# Patient Record
Sex: Male | Born: 1970 | Race: White | Hispanic: No | Marital: Single | Smoking: Never smoker
Health system: Southern US, Community
[De-identification: ages and names within clinical notes are randomized; demographics above are authoritative.]

## PROBLEM LIST (undated history)

## (undated) DIAGNOSIS — R51 Headache: Secondary | ICD-10-CM

## (undated) DIAGNOSIS — C801 Malignant (primary) neoplasm, unspecified: Secondary | ICD-10-CM

## (undated) DIAGNOSIS — R519 Headache, unspecified: Secondary | ICD-10-CM

## (undated) HISTORY — PX: SOFT TISSUE MASS EXCISION: SHX2419

---

## 2000-11-14 ENCOUNTER — Emergency Department (HOSPITAL_COMMUNITY): Admission: EM | Admit: 2000-11-14 | Discharge: 2000-11-14 | Payer: Self-pay | Admitting: Emergency Medicine

## 2000-11-14 ENCOUNTER — Encounter: Payer: Self-pay | Admitting: Internal Medicine

## 2000-11-17 ENCOUNTER — Ambulatory Visit (HOSPITAL_COMMUNITY): Admission: RE | Admit: 2000-11-17 | Discharge: 2000-11-17 | Payer: Self-pay | Admitting: Internal Medicine

## 2000-11-17 ENCOUNTER — Encounter: Payer: Self-pay | Admitting: Internal Medicine

## 2005-04-04 ENCOUNTER — Other Ambulatory Visit: Admission: RE | Admit: 2005-04-04 | Discharge: 2005-04-04 | Payer: Self-pay | Admitting: Urology

## 2007-11-30 ENCOUNTER — Emergency Department (HOSPITAL_COMMUNITY): Admission: EM | Admit: 2007-11-30 | Discharge: 2007-11-30 | Payer: Self-pay | Admitting: Emergency Medicine

## 2010-05-08 ENCOUNTER — Emergency Department (HOSPITAL_COMMUNITY): Payer: BC Managed Care – PPO

## 2010-05-08 ENCOUNTER — Emergency Department (HOSPITAL_COMMUNITY)
Admission: EM | Admit: 2010-05-08 | Discharge: 2010-05-08 | Disposition: A | Discharge status: Home / Self Care | Payer: BC Managed Care – PPO | Attending: Emergency Medicine | Admitting: Emergency Medicine

## 2010-05-08 DIAGNOSIS — S025XXA Fracture of tooth (traumatic), initial encounter for closed fracture: Secondary | ICD-10-CM | POA: Insufficient documentation

## 2010-05-08 DIAGNOSIS — R296 Repeated falls: Secondary | ICD-10-CM | POA: Insufficient documentation

## 2010-05-08 DIAGNOSIS — S060X9A Concussion with loss of consciousness of unspecified duration, initial encounter: Secondary | ICD-10-CM | POA: Insufficient documentation

## 2010-05-08 DIAGNOSIS — R55 Syncope and collapse: Secondary | ICD-10-CM | POA: Insufficient documentation

## 2010-05-08 DIAGNOSIS — IMO0002 Reserved for concepts with insufficient information to code with codable children: Secondary | ICD-10-CM | POA: Insufficient documentation

## 2010-05-08 DIAGNOSIS — R4182 Altered mental status, unspecified: Secondary | ICD-10-CM | POA: Insufficient documentation

## 2010-05-08 DIAGNOSIS — S0990XA Unspecified injury of head, initial encounter: Secondary | ICD-10-CM | POA: Insufficient documentation

## 2010-05-08 DIAGNOSIS — Y998 Other external cause status: Secondary | ICD-10-CM | POA: Insufficient documentation

## 2010-05-08 LAB — COMPREHENSIVE METABOLIC PANEL
ALT: 22 U/L (ref 0–53)
AST: 21 U/L (ref 0–37)
Albumin: 4.2 g/dL (ref 3.5–5.2)
Alkaline Phosphatase: 40 U/L (ref 39–117)
CO2: 26 mEq/L (ref 19–32)
Chloride: 104 mEq/L (ref 96–112)
GFR calc Af Amer: >60 mL/min (ref >60)
GFR calc non Af Amer: >60 mL/min (ref >60)
Potassium: 3.7 mEq/L (ref 3.5–5.1)
Sodium: 137 mEq/L (ref 135–145)
Total Bilirubin: 0.9 mg/dL (ref 0.3–1.2)

## 2010-05-08 LAB — POCT CARDIAC MARKERS
Myoglobin, poc: 115 ng/mL (ref 12–200)
Myoglobin, poc: 151 ng/mL (ref 12–200)
Troponin i, poc: <0.05 ng/mL (ref 0.00–0.09)

## 2010-05-08 LAB — URINALYSIS, ROUTINE W REFLEX MICROSCOPIC
Glucose, UA: NEGATIVE mg/dL
Hgb urine dipstick: NEGATIVE
Protein, ur: NEGATIVE mg/dL
Specific Gravity, Urine: 1.025 (ref 1.005–1.030)
pH: 5.5 (ref 5.0–8.0)

## 2010-05-08 LAB — CBC
Hemoglobin: 16.7 g/dL (ref 13.0–17.0)
MCH: 30.1 pg (ref 26.0–34.0)
Platelets: 187 10*3/uL (ref 150–400)
RBC: 5.55 MIL/uL (ref 4.22–5.81)
WBC: 7.3 10*3/uL (ref 4.0–10.5)

## 2010-05-08 LAB — DIFFERENTIAL
Basophils Relative: 0 % (ref 0–1)
Eosinophils Absolute: 0.2 10*3/uL (ref 0.0–0.7)
Lymphs Abs: 2.4 10*3/uL (ref 0.7–4.0)
Monocytes Relative: 8 % (ref 3–12)
Neutro Abs: 4.1 10*3/uL (ref 1.7–7.7)
Neutrophils Relative %: 56 % (ref 43–77)

## 2010-10-29 LAB — CBC
HCT: 48
Hemoglobin: 16
MCHC: 33.4
Platelets: 218
RDW: 12.2

## 2010-10-29 LAB — DIFFERENTIAL
Basophils Absolute: 0
Basophils Relative: 0
Eosinophils Relative: 1
Monocytes Absolute: 0.6
Neutro Abs: 5.6

## 2010-10-29 LAB — BASIC METABOLIC PANEL
BUN: 14
CO2: 27
Calcium: 9.6
Glucose, Bld: 96
Sodium: 140

## 2013-11-07 ENCOUNTER — Observation Stay (HOSPITAL_COMMUNITY)
Admission: EM | Admit: 2013-11-07 | Discharge: 2013-11-08 | Disposition: A | Discharge status: Home / Self Care | Payer: 59 | Attending: Internal Medicine | Admitting: Internal Medicine

## 2013-11-07 ENCOUNTER — Encounter (HOSPITAL_COMMUNITY): Payer: Self-pay | Admitting: Emergency Medicine

## 2013-11-07 ENCOUNTER — Observation Stay (HOSPITAL_COMMUNITY)
Admission: EM | Admit: 2013-11-07 | Discharge: 2013-11-07 | Disposition: A | Discharge status: Home / Self Care | Payer: 59 | Source: Home / Self Care | Attending: Internal Medicine | Admitting: Internal Medicine

## 2013-11-07 ENCOUNTER — Emergency Department (HOSPITAL_COMMUNITY): Payer: 59

## 2013-11-07 DIAGNOSIS — R569 Unspecified convulsions: Secondary | ICD-10-CM | POA: Diagnosis not present

## 2013-11-07 DIAGNOSIS — G8929 Other chronic pain: Secondary | ICD-10-CM | POA: Insufficient documentation

## 2013-11-07 DIAGNOSIS — R079 Chest pain, unspecified: Secondary | ICD-10-CM

## 2013-11-07 DIAGNOSIS — R51 Headache: Secondary | ICD-10-CM

## 2013-11-07 DIAGNOSIS — N179 Acute kidney failure, unspecified: Secondary | ICD-10-CM

## 2013-11-07 DIAGNOSIS — R519 Headache, unspecified: Secondary | ICD-10-CM | POA: Diagnosis present

## 2013-11-07 DIAGNOSIS — R400 Somnolence: Secondary | ICD-10-CM | POA: Diagnosis present

## 2013-11-07 HISTORY — DX: Headache: R51

## 2013-11-07 HISTORY — DX: Headache, unspecified: R51.9

## 2013-11-07 LAB — CBC WITH DIFFERENTIAL/PLATELET
Basophils Absolute: 0 10*3/uL (ref 0.0–0.1)
Basophils Relative: 0 % (ref 0–1)
EOS ABS: 0.1 10*3/uL (ref 0.0–0.7)
EOS PCT: 1 % (ref 0–5)
HEMATOCRIT: 44.3 % (ref 39.0–52.0)
HEMOGLOBIN: 15.5 g/dL (ref 13.0–17.0)
LYMPHS ABS: 1.8 10*3/uL (ref 0.7–4.0)
LYMPHS PCT: 26 % (ref 12–46)
MCH: 29.9 pg (ref 26.0–34.0)
MCHC: 35 g/dL (ref 30.0–36.0)
MCV: 85.5 fL (ref 78.0–100.0)
MONO ABS: 0.4 10*3/uL (ref 0.1–1.0)
MONOS PCT: 5 % (ref 3–12)
Neutro Abs: 4.9 10*3/uL (ref 1.7–7.7)
Neutrophils Relative %: 68 % (ref 43–77)
PLATELETS: 182 10*3/uL (ref 150–400)
RBC: 5.18 MIL/uL (ref 4.22–5.81)
RDW: 11.9 % (ref 11.5–15.5)
WBC: 7.2 10*3/uL (ref 4.0–10.5)

## 2013-11-07 LAB — I-STAT CHEM 8, ED
BUN: 21 mg/dL (ref 6–23)
CREATININE: 1.4 mg/dL — AB (ref 0.50–1.35)
Calcium, Ion: 1.17 mmol/L (ref 1.12–1.23)
Chloride: 105 mEq/L (ref 96–112)
Glucose, Bld: 144 mg/dL — ABNORMAL HIGH (ref 70–99)
HEMATOCRIT: 48 % (ref 39.0–52.0)
HEMOGLOBIN: 16.3 g/dL (ref 13.0–17.0)
Potassium: 3.9 mEq/L (ref 3.7–5.3)
SODIUM: 138 meq/L (ref 137–147)
TCO2: 24 mmol/L (ref 0–100)

## 2013-11-07 LAB — TROPONIN I
Troponin I: <0.3 ng/mL (ref <0.30)
Troponin I: <0.3 ng/mL (ref <0.30)

## 2013-11-07 LAB — RAPID URINE DRUG SCREEN, HOSP PERFORMED
Amphetamines: NOT DETECTED
Barbiturates: NOT DETECTED
Benzodiazepines: NOT DETECTED
Cocaine: NOT DETECTED
Opiates: NOT DETECTED
Tetrahydrocannabinol: NOT DETECTED

## 2013-11-07 LAB — COMPREHENSIVE METABOLIC PANEL
ALT: 23 U/L (ref 0–53)
ANION GAP: 13 (ref 5–15)
AST: 17 U/L (ref 0–37)
Albumin: 3.8 g/dL (ref 3.5–5.2)
Alkaline Phosphatase: 52 U/L (ref 39–117)
BUN: 22 mg/dL (ref 6–23)
CALCIUM: 9.2 mg/dL (ref 8.4–10.5)
CO2: 24 meq/L (ref 19–32)
Chloride: 101 mEq/L (ref 96–112)
Creatinine, Ser: 1.36 mg/dL — ABNORMAL HIGH (ref 0.50–1.35)
GFR, EST AFRICAN AMERICAN: 73 mL/min — AB (ref >90)
GFR, EST NON AFRICAN AMERICAN: 63 mL/min — AB (ref >90)
GLUCOSE: 142 mg/dL — AB (ref 70–99)
Potassium: 3.8 mEq/L (ref 3.7–5.3)
Sodium: 138 mEq/L (ref 137–147)
TOTAL PROTEIN: 7 g/dL (ref 6.0–8.3)
Total Bilirubin: 0.4 mg/dL (ref 0.3–1.2)

## 2013-11-07 LAB — URINALYSIS, ROUTINE W REFLEX MICROSCOPIC
BILIRUBIN URINE: NEGATIVE
Glucose, UA: NEGATIVE mg/dL
HGB URINE DIPSTICK: NEGATIVE
Ketones, ur: NEGATIVE mg/dL
Leukocytes, UA: NEGATIVE
NITRITE: NEGATIVE
PROTEIN: NEGATIVE mg/dL
SPECIFIC GRAVITY, URINE: 1.015 (ref 1.005–1.030)
UROBILINOGEN UA: 0.2 mg/dL (ref 0.0–1.0)
pH: 6.5 (ref 5.0–8.0)

## 2013-11-07 LAB — ACETAMINOPHEN LEVEL

## 2013-11-07 LAB — ETHANOL: Alcohol, Ethyl (B): <11 mg/dL (ref 0–11)

## 2013-11-07 LAB — I-STAT CG4 LACTIC ACID, ED: Lactic Acid, Venous: 2.44 mmol/L — ABNORMAL HIGH (ref 0.5–2.2)

## 2013-11-07 LAB — D-DIMER, QUANTITATIVE (NOT AT ARMC)

## 2013-11-07 LAB — SALICYLATE LEVEL

## 2013-11-07 MED ORDER — LORAZEPAM 2 MG/ML IJ SOLN: 1.0000 mg | INTRAMUSCULAR | Status: DC | PRN

## 2013-11-07 MED ORDER — BISACODYL 10 MG RE SUPP
10.0000 mg | Freq: Every day | RECTAL | Status: DC | PRN
Start: 2013-11-07 — End: 2013-11-08

## 2013-11-07 MED ORDER — ENOXAPARIN SODIUM 40 MG/0.4ML ~~LOC~~ SOLN
40.0000 mg | SUBCUTANEOUS | Status: DC
  Administered 2013-11-07: 40 mg via SUBCUTANEOUS
  Filled 2013-11-07: qty 0.4

## 2013-11-07 MED ORDER — HYDROCODONE-ACETAMINOPHEN 5-325 MG PO TABS
1.0000 | ORAL_TABLET | ORAL | Status: DC | PRN
  Administered 2013-11-07 – 2013-11-08 (×4): 1 via ORAL
  Filled 2013-11-07 (×4): qty 1

## 2013-11-07 MED ORDER — SODIUM CHLORIDE 0.9 % IV BOLUS (SEPSIS)
1000.0000 mL | Freq: Once | INTRAVENOUS | Status: AC
  Administered 2013-11-07: 1000 mL via INTRAVENOUS

## 2013-11-07 MED ORDER — ONDANSETRON HCL 4 MG PO TABS
4.0000 mg | ORAL_TABLET | Freq: Four times a day (QID) | ORAL | Status: DC | PRN
Start: 2013-11-07 — End: 2013-11-08

## 2013-11-07 MED ORDER — SODIUM CHLORIDE 0.9 % IV SOLN
INTRAVENOUS | Status: DC
  Administered 2013-11-07 – 2013-11-08 (×2): via INTRAVENOUS

## 2013-11-07 MED ORDER — ONDANSETRON HCL 4 MG/2ML IJ SOLN
4.0000 mg | Freq: Four times a day (QID) | INTRAMUSCULAR | Status: DC | PRN
Start: 2013-11-07 — End: 2013-11-08

## 2013-11-07 MED ORDER — SENNOSIDES-DOCUSATE SODIUM 8.6-50 MG PO TABS: 1.0000 | ORAL_TABLET | Freq: Every evening | ORAL | Status: DC | PRN

## 2013-11-07 MED ORDER — ACETAMINOPHEN 325 MG PO TABS
650.0000 mg | ORAL_TABLET | Freq: Four times a day (QID) | ORAL | Status: DC | PRN
  Administered 2013-11-07: 650 mg via ORAL
  Filled 2013-11-07: qty 2

## 2013-11-07 MED ORDER — ACETAMINOPHEN 650 MG RE SUPP: 650.0000 mg | Freq: Four times a day (QID) | RECTAL | Status: DC | PRN

## 2013-11-07 MED ORDER — LORAZEPAM 2 MG/ML IJ SOLN
1.0000 mg | Freq: Once | INTRAMUSCULAR | Status: AC
  Administered 2013-11-07: 08:00:00 via INTRAVENOUS
  Filled 2013-11-07: qty 1

## 2013-11-07 MED ORDER — ALUM & MAG HYDROXIDE-SIMETH 200-200-20 MG/5ML PO SUSP: 30.0000 mL | Freq: Four times a day (QID) | ORAL | Status: DC | PRN

## 2013-11-07 NOTE — ED Notes (Signed)
MD at bedside. 

## 2013-11-07 NOTE — Procedures (Signed)
  [place] A. Merlene Laughter, MD     [email or web address]           HISTORY: THE PATIENT IS A 43-YEAR-OLD MAN WHO PRESENTS WITH NEW ONSET SEIZURES.  MEDICATIONS: Scheduled Meds: . enoxaparin (LOVENOX) injection  40 mg Subcutaneous Q24H   Continuous Infusions: . sodium chloride 75 mL/hr at 11/07/13 1313   PRN Meds:.acetaminophen, acetaminophen, alum & mag hydroxide-simeth, bisacodyl, HYDROcodone-acetaminophen, LORazepam, ondansetron (ZOFRAN) IV, ondansetron, senna-docusate  Prior to Admission medications   Medication Sig Start Date End Date Taking? Authorizing Provider  ibuprofen (ADVIL,MOTRIN) 200 MG tablet Take 600-800 mg by mouth daily as needed for headache.   Yes Historical Provider, MD      ANALYSIS: A 16 channel recording using standard 10 20 measurements is conducted for 20 minutes. There is a well-formed posterior dominant rhythm of 10 1/2-11 Hz which attenuates with eye opening. There is beta activity observed in the frontal areas. Awake and sleep activities are observed throughout the recording. K complexes and sleep spindles are observed indicative of non-REM stage II sleep.Photic stimulation is carried out without abnormal changes in the background activity. There are no focal or lateralized slowing observed throughout the recording. There is no epileptic form activity observed.   IMPRESSION: 1. This a normal recording awake and sleep states.      [place] Makara A. Merlene Laughter, M.D.  Diplomate, Tax adviser of Psychiatry and Neurology ( Neurology).

## 2013-11-07 NOTE — Progress Notes (Signed)
EEG Completed; Results Pending  

## 2013-11-07 NOTE — Progress Notes (Signed)
Pt and his wife state that they have an advance directive but can not obtain a copy at this time.  I explained that for the hospital to honor the directive we would need a copy.  Both the pt and his spouse are aware of this and continue to state they can not provide a copy

## 2013-11-07 NOTE — H&P (Signed)
Agree with above note. Patient admitted with seizure activity. Has never had seizures before. Workup is basically unremarkable. Appreciate neurology input.  Domingo Mend, MD [facility] Pager: [phone]

## 2013-11-07 NOTE — H&P (Signed)
[facility] History and Physical  Brandon Rivers JJK:[id number] DOB: [birth year] DOA: 11/07/2013  Referring physician:  PCP: Glo Herring., MD   Chief Complaint: LOC  HPI: Brandon Rivers is a 43 y.o. male with a past medical history that includes chronic headaches about every other day presents to the emergency department from home via EMS with the chief complaint of loss of consciousness. Permission is obtained from the wife is at the bedside and she reported that her asthma was in the bathroom and called out her name. Time she got to the bathroom he was "staring straight forward, would not answer me and then he slumped over". She reports some foaming at the mouth and generalized whole body "twitching". She states the episode was very brief and that she and her sons assisted him to the floor. During this time he indicated that his chest hurt. There is no report of any urinary or bowel incontinence. Wife denies any recent illness fever, nausea vomiting diarrhea. She states he has been eating and drinking his normal amount.. No history of similar episodes or seizure activity.  Workup in the emergency department includes a CT of the head that is unremarkable, chest x-ray t that is unremarkable, basic metabolic panel with a creatinine of 1.4, complete blood count is unremarkable, troponin negative, lactic acid 2.44, urinalysis is unremarkable urine drug screen is unremarkable and EKG that shows a normal sinus rhythm.  Emergency department he is hemodynamically stable afebrile not hypoxic. He is obtunded but does respond to pain and moving all extremities spontaneously.  In the emergency department he is given 1 L of normal saline intravenously and Ativan 1 mg intravenously. At the time of my exam he opened his eyes briefly to verbal stimuli and attempts to answer yes no questions with a nod of the head. Otherwise he does not follow commands.   Review of Systems:  10 point review of systems  completed with wife and all systems are negative except as indicated in the history of present illness   Past Medical History  Diagnosis Date  . Headache     chronic; every other day   History reviewed. No pertinent past surgical history. Social History:  reports that he has never smoked. He does not have any smokeless tobacco history on file. He reports that he does not drink alcohol or use illicit drugs. He is married and lives with his wife and their children. He is self-employed. He is independent with ADLs Not on File  History reviewed. No pertinent family history. mother is alive and at the bedside with a history of hypertension. Sister at bedside with no medical history. Father's medical history unknown  Prior to Admission medications   Medication Sig Start Date End Date Taking? Authorizing Provider  ibuprofen (ADVIL,MOTRIN) 200 MG tablet Take 600-800 mg by mouth daily as needed for headache.   Yes Historical Provider, MD   Physical Exam: Filed Vitals:   11/07/13 0930 11/07/13 1000 11/07/13 1030 11/07/13 1100  BP: 124/86 130/85 100/64 119/88  Pulse: 76 75 72 79  Temp:      TempSrc:      Resp: 17 20 17 17   Height:      Weight:      SpO2: 97% 96% 97% 99%    Wt Readings from Last 3 Encounters:  11/07/13 90.719 kg (200 lb)    General:  Very lethargic but opens eyes briefly to verbal stimuli. Unable to follow commands Eyes: PERRL, normal lids, irises &  conjunctiva ENT: grossly normal hearing, mucous membranes of his mouth are pink but somewhat dry Neck: no LAD, masses or thyromegaly Cardiovascular: RRR, no m/r/g. No LE edema. Pedal pulses present and palpable Respiratory: CTA bilaterally, no w/r/r. Normal respiratory effort. Abdomen: soft, ntnd positive bowel sounds throughout nontender to palpation Skin: no rash or induration seen on limited exam Musculoskeletal: grossly normal tone BUE/BLE Psychiatric: grossly normal mood and affect, speech fluent and  appropriate Neurologic: Very lethargic will open eyes to verbal stimuli pupils equal round reactive to light unable to follow commands but moves all extremities slowly spontaneously           Labs on Admission:  Basic Metabolic Panel:  Recent Labs Lab 11/07/13 0827 11/07/13 0834  NA 138 138  K 3.8 3.9  CL 101 105  CO2 24  --   GLUCOSE 142* 144*  BUN 22 21  CREATININE 1.36* 1.40*  CALCIUM 9.2  --    Liver Function Tests:  Recent Labs Lab 11/07/13 0827  AST 17  ALT 23  ALKPHOS 52  BILITOT 0.4  PROT 7.0  ALBUMIN 3.8   No results found for this basename: LIPASE, AMYLASE,  in the last 168 hours No results found for this basename: AMMONIA,  in the last 168 hours CBC:  Recent Labs Lab 11/07/13 0827 11/07/13 0834  WBC 7.2  --   NEUTROABS 4.9  --   HGB 15.5 16.3  HCT 44.3 48.0  MCV 85.5  --   PLT 182  --    Cardiac Enzymes:  Recent Labs Lab 11/07/13 0827  TROPONINI <0.30    BNP (last 3 results) No results found for this basename: PROBNP,  in the last 8760 hours CBG: No results found for this basename: GLUCAP,  in the last 168 hours  Radiological Exams on Admission: Ct Head Wo Contrast  11/07/2013   CLINICAL DATA:  43 year old male with witnessed seizure at home. Positive loss of consciousness. Initial encounter.  EXAM: CT HEAD WITHOUT CONTRAST  TECHNIQUE: Contiguous axial images were obtained from the base of the skull through the vertex without intravenous contrast.  COMPARISON:  Head CT 05/08/2010.  FINDINGS: Chronic ethmoid sinus mucosal thickening, mildly increased on the right. Other Visualized paranasal sinuses and mastoids are clear. No scalp hematoma. No acute orbit or scalp soft tissue findings identified.  Intermittent mild motion artifact. Normal cerebral volume. No midline shift, ventriculomegaly, mass effect, evidence of mass lesion, intracranial hemorrhage or evidence of cortically based acute infarction. Gray-white matter differentiation is within  normal limits throughout the brain. No suspicious intracranial vascular hyperdensity.  IMPRESSION: Stable and normal noncontrast CT appearance of the brain.   Electronically Signed   By: Lars Pinks M.D.   On: 11/07/2013 08:52   Dg Chest Portable 1 View  11/07/2013   CLINICAL DATA:  Loss of consciousness.  EXAM: PORTABLE CHEST - 1 VIEW  COMPARISON:  None.  FINDINGS: Low lung volumes. Bibasilar atelectasis and mild vascular congestion. Heart is normal size. No effusions or acute bony abnormality.  IMPRESSION: Low lung volumes with bibasilar atelectasis.  No acute findings.   Electronically Signed   By: Rolm Baptise M.D.   On: 11/07/2013 08:50    EKG: Sinus rhythm  Assessment/Plan Principal Problem:   Post-ictal state: From presumed seizure. No history of same. He was given 1 mg of Ativan in the emergency department. Will admit to telemetry for observation overnight. Will place on seizure precautions. Continue Ativan as needed for any further seizure activity.  Obtain an EEG. Request a neuro consult Active Problems: Seizure: No history of seizures. Does have a long history of chronic headaches about every other day for which he takes Advil. CT of the head unremarkable. Urine drug screen unremarkable. No indication of any infectious process or metabolic abnormalities. Will observe overnight. Will obtain an EEG. Have requested a neurology consult. Will use Ativan as needed for any further seizure activity. Place on seizure precautions    Chest pain: Reportedly patient complained of anterior chest pain. Initial troponin negative. EKG with normal sinus rhythm. Will cycle troponins repeat an EKG in the morning. Provide pain med as needed.  Acute kidney injury: No 1.4 on admission. May be related to #1. Wife states he has been eating and drinking his normal amount. Will obtain a urinalysis. Provide gentle IV hydration. Hold any nephrotoxins. Monitor his urine output. Recheck in the am     Chronic headaches:  History of same. Pain medicine as indicated. See #1 and #2  neurology Code Status: full DVT Prophylaxis: Family Communication: wife mother sister at bedside Disposition Plan: home hopefully in am  Time spent: 35 minutes  [facility] Pager [phone]

## 2013-11-07 NOTE — ED Notes (Signed)
PCXR done

## 2013-11-07 NOTE — Consult Note (Signed)
Brandon A. Merlene Laughter, MD     [email or web address]          Brandon Rivers is an 43 y.o. male.   ASSESSMENT/PLAN: 1. Unexplained episode of alteration of consciousness, unresponsiveness associated with a few jerking activity suspicious for possible seizures. The patient however also had left-sided chest pain and dyspnea along with headaches. Workup so far has been unrevealing. A brain MRI will be obtained. D-dimer is also be obtained.  2. Baseline history of frequent headaches of unclear etiology. Brain MRI will be obtained.  3. Hyperglycemia.  The patient is a 43 year old white male who woke up in the early morning and felt abdominal cramping and discomfort. He went to use the bathroom and apparently had a bowel movement. The patient was noted by his wife to be unresponsive staring and does not feeling well. He reports that his face was numb and tingly and red. The wife reports that his face was in the quite red. The wife also indicates that his eyes were also quite red. Again, she reports that he was unresponsive staring off in space. He did have a few jerky spells involving upper and lower extremities. He did not lose consciousness. The patient however does not remember much of the event after the spell initially started. He does report having significant excruciating left-sided chest pain when they tried to move him. He also did have significant dyspnea. It appears that he also had photophobia which resulted in patient having severe headache as time went on especially when he came to the emergency room. He does have a baseline history of almost daily headaches. No focal deficits or changes focal numbness, weakness, dysphagia or dysarthria.  GENERAL: He appears to be in some discomfort from headaches but is in no acute distress.  HEENT: Supple. Atraumatic normocephalic.   ABDOMEN: soft  EXTREMITIES: No edema   BACK: Normal.  SKIN: Normal by inspection.    MENTAL  STATUS: Alert and oriented. Speech, language and cognition are generally intact. Judgment and insight normal.   CRANIAL NERVES: Pupils are equal, round and reactive to light and accommodation; extra ocular movements are full, there is no significant nystagmus; visual fields are full; upper and lower facial muscles are normal in strength and symmetric, there is no flattening of the nasolabial folds; tongue is midline; uvula is midline; shoulder elevation is normal.  MOTOR: Normal tone, bulk and strength; no pronator drift.  COORDINATION: Left finger to nose is normal, right finger to nose is normal, No rest tremor; no intention tremor; no postural tremor; no bradykinesia.  REFLEXES: Deep tendon reflexes are symmetrical and normal. Babinski reflexes are flexor bilaterally.   SENSATION: Normal to light touch.     Blood pressure 153/87, pulse 74, temperature 98.1 F (36.7 C), temperature source Oral, resp. rate 18, height 6' (1.829 m), weight 90.719 kg (200 lb), SpO2 97.00%.  Past Medical History  Diagnosis Date  . Headache     chronic; every other day    History reviewed. No pertinent past surgical history.  History reviewed. No pertinent family history.  Social History:  reports that he has never smoked. He does not have any smokeless tobacco history on file. He reports that he does not drink alcohol or use illicit drugs.  Allergies: No Known Allergies  Medications: Prior to Admission medications   Medication Sig Start Date End Date Taking? Authorizing Provider  ibuprofen (ADVIL,MOTRIN) 200 MG tablet Take 600-800 mg by mouth daily as needed for headache.  Yes Historical Provider, MD    Scheduled Meds: . enoxaparin (LOVENOX) injection  40 mg Subcutaneous Q24H   Continuous Infusions: . sodium chloride 75 mL/hr at 11/07/13 1313   PRN Meds:.acetaminophen, acetaminophen, alum & mag hydroxide-simeth, bisacodyl, HYDROcodone-acetaminophen, LORazepam, ondansetron (ZOFRAN) IV,  ondansetron, senna-docusate     Results for orders placed during the hospital encounter of 11/07/13 (from the past 48 hour(s))  CBC WITH DIFFERENTIAL     Status: None   Collection Time    11/07/13  8:27 AM      Result Value Ref Range   WBC 7.2  4.0 - 10.5 K/uL   RBC 5.18  4.22 - 5.81 MIL/uL   Hemoglobin 15.5  13.0 - 17.0 g/dL   HCT 44.3  39.0 - 52.0 %   MCV 85.5  78.0 - 100.0 fL   MCH 29.9  26.0 - 34.0 pg   MCHC 35.0  30.0 - 36.0 g/dL   RDW 11.9  11.5 - 15.5 %   Platelets 182  150 - 400 K/uL   Neutrophils Relative % 68  43 - 77 %   Neutro Abs 4.9  1.7 - 7.7 K/uL   Lymphocytes Relative 26  12 - 46 %   Lymphs Abs 1.8  0.7 - 4.0 K/uL   Monocytes Relative 5  3 - 12 %   Monocytes Absolute 0.4  0.1 - 1.0 K/uL   Eosinophils Relative 1  0 - 5 %   Eosinophils Absolute 0.1  0.0 - 0.7 K/uL   Basophils Relative 0  0 - 1 %   Basophils Absolute 0.0  0.0 - 0.1 K/uL  COMPREHENSIVE METABOLIC PANEL     Status: Abnormal   Collection Time    11/07/13  8:27 AM      Result Value Ref Range   Sodium 138  137 - 147 mEq/L   Potassium 3.8  3.7 - 5.3 mEq/L   Chloride 101  96 - 112 mEq/L   CO2 24  19 - 32 mEq/L   Glucose, Bld 142 (*) 70 - 99 mg/dL   BUN 22  6 - 23 mg/dL   Creatinine, Ser 1.36 (*) 0.50 - 1.35 mg/dL   Calcium 9.2  8.4 - 10.5 mg/dL   Total Protein 7.0  6.0 - 8.3 g/dL   Albumin 3.8  3.5 - 5.2 g/dL   AST 17  0 - 37 U/L   ALT 23  0 - 53 U/L   Alkaline Phosphatase 52  39 - 117 U/L   Total Bilirubin 0.4  0.3 - 1.2 mg/dL   GFR calc non Af Amer 63 (*) >90 mL/min   GFR calc Af Amer 73 (*) >90 mL/min   Comment: (NOTE)     The eGFR has been calculated using the CKD EPI equation.     This calculation has not been validated in all clinical situations.     eGFR's persistently <90 mL/min signify possible Chronic Kidney     Disease.   Anion gap 13  5 - 15  TROPONIN I     Status: None   Collection Time    11/07/13  8:27 AM      Result Value Ref Range   Troponin I <0.30  <0.30 ng/mL    Comment:            Due to the release kinetics of cTnI,     a negative result within the first hours     of the onset of symptoms does not  rule out     myocardial infarction with certainty.     If myocardial infarction is still suspected,     repeat the test at appropriate intervals.  ETHANOL     Status: None   Collection Time    11/07/13  8:27 AM      Result Value Ref Range   Alcohol, Ethyl (B) <11  0 - 11 mg/dL   Comment:            LOWEST DETECTABLE LIMIT FOR     SERUM ALCOHOL IS 11 mg/dL     FOR MEDICAL PURPOSES ONLY  ACETAMINOPHEN LEVEL     Status: None   Collection Time    11/07/13  8:27 AM      Result Value Ref Range   Acetaminophen (Tylenol), Serum <15.0  10 - 30 ug/mL   Comment:            THERAPEUTIC CONCENTRATIONS VARY     SIGNIFICANTLY. A RANGE OF 10-30     ug/mL MAY BE AN EFFECTIVE     CONCENTRATION FOR MANY PATIENTS.     HOWEVER, SOME ARE BEST TREATED     AT CONCENTRATIONS OUTSIDE THIS     RANGE.     ACETAMINOPHEN CONCENTRATIONS     >150 ug/mL AT 4 HOURS AFTER     INGESTION AND >50 ug/mL AT 12     HOURS AFTER INGESTION ARE     OFTEN ASSOCIATED WITH TOXIC     REACTIONS.  SALICYLATE LEVEL     Status: Abnormal   Collection Time    11/07/13  8:27 AM      Result Value Ref Range   Salicylate Lvl <5.6 (*) 2.8 - 20.0 mg/dL  I-STAT CHEM 8, ED     Status: Abnormal   Collection Time    11/07/13  8:34 AM      Result Value Ref Range   Sodium 138  137 - 147 mEq/L   Potassium 3.9  3.7 - 5.3 mEq/L   Chloride 105  96 - 112 mEq/L   BUN 21  6 - 23 mg/dL   Creatinine, Ser 1.40 (*) 0.50 - 1.35 mg/dL   Glucose, Bld 144 (*) 70 - 99 mg/dL   Calcium, Ion 1.17  1.12 - 1.23 mmol/L   TCO2 24  0 - 100 mmol/L   Hemoglobin 16.3  13.0 - 17.0 g/dL   HCT 48.0  39.0 - 52.0 %  I-STAT CG4 LACTIC ACID, ED     Status: Abnormal   Collection Time    11/07/13  8:40 AM      Result Value Ref Range   Lactic Acid, Venous 2.44 (*) 0.5 - 2.2 mmol/L  URINALYSIS, ROUTINE W REFLEX MICROSCOPIC      Status: None   Collection Time    11/07/13 11:20 AM      Result Value Ref Range   Color, Urine YELLOW  YELLOW   APPearance CLEAR  CLEAR   Specific Gravity, Urine 1.015  1.005 - 1.030   pH 6.5  5.0 - 8.0   Glucose, UA NEGATIVE  NEGATIVE mg/dL   Hgb urine dipstick NEGATIVE  NEGATIVE   Bilirubin Urine NEGATIVE  NEGATIVE   Ketones, ur NEGATIVE  NEGATIVE mg/dL   Protein, ur NEGATIVE  NEGATIVE mg/dL   Urobilinogen, UA 0.2  0.0 - 1.0 mg/dL   Nitrite NEGATIVE  NEGATIVE   Leukocytes, UA NEGATIVE  NEGATIVE   Comment: MICROSCOPIC NOT DONE ON URINES WITH NEGATIVE PROTEIN, BLOOD, LEUKOCYTES,  NITRITE, OR GLUCOSE <1000 mg/dL.  URINE RAPID DRUG SCREEN (HOSP PERFORMED)     Status: None   Collection Time    11/07/13 11:20 AM      Result Value Ref Range   Opiates NONE DETECTED  NONE DETECTED   Cocaine NONE DETECTED  NONE DETECTED   Benzodiazepines NONE DETECTED  NONE DETECTED   Amphetamines NONE DETECTED  NONE DETECTED   Tetrahydrocannabinol NONE DETECTED  NONE DETECTED   Barbiturates NONE DETECTED  NONE DETECTED   Comment:            DRUG SCREEN FOR MEDICAL PURPOSES     ONLY.  IF CONFIRMATION IS NEEDED     FOR ANY PURPOSE, NOTIFY LAB     WITHIN 5 DAYS.                LOWEST DETECTABLE LIMITS     FOR URINE DRUG SCREEN     Drug Class       Cutoff (ng/mL)     Amphetamine      1000     Barbiturate      200     Benzodiazepine   671     Tricyclics       245     Opiates          300     Cocaine          300     THC              50  TROPONIN I     Status: None   Collection Time    11/07/13  3:40 PM      Result Value Ref Range   Troponin I <0.30  <0.30 ng/mL   Comment:            Due to the release kinetics of cTnI,     a negative result within the first hours     of the onset of symptoms does not rule out     myocardial infarction with certainty.     If myocardial infarction is still suspected,     repeat the test at appropriate intervals.    Studies/Results:  HEAD CT COMPARISON:  Head CT 05/08/2010.  FINDINGS:  Chronic ethmoid sinus mucosal thickening, mildly increased on the  right. Other Visualized paranasal sinuses and mastoids are clear. No  scalp hematoma. No acute orbit or scalp soft tissue findings  identified.  Intermittent mild motion artifact. Normal cerebral volume. No  midline shift, ventriculomegaly, mass effect, evidence of mass  lesion, intracranial hemorrhage or evidence of cortically based  acute infarction. Gray-white matter differentiation is within normal  limits throughout the brain. No suspicious intracranial vascular  hyperdensity.  IMPRESSION:  Stable and normal noncontrast CT appearance of the brain.   EEG unremarkable.  Cassara Nida A. Merlene Rivers, M.D.  Diplomate, Tax adviser of Psychiatry and Neurology ( Neurology). 11/07/2013, 8:34 PM

## 2013-11-07 NOTE — ED Provider Notes (Signed)
CSN: [id number]     Arrival date & time 11/07/13  9563 History  This chart was scribe for Brandon Essex, MD by Judithann Sauger, ED Scribe. The patient was seen in room APA18/APA18 and the patient's care was started at 8:15 AM.   Chief Complaint  Patient presents with  . Loss of Consciousness    The history is provided by the spouse (son). No language interpreter was used.   HPI Comments: Brandon Rivers is a 43 y.o. male who presents to the Emergency Department complaining of a witnessed seizure lasting 10 seconds that occurred about 1 hour ago. His wife states patient was sitting on the toilet when he called out to her. She states he was not able to speak afterwards and saw that he became slumped over, foaming at the mouth, and shaking. His son states patient was grabbing his chest after this episode. Wife denies any incontinence. She denies a history of seizures or similar episodes in the past. He does not take any regular medications. She denies recent diarrhea, vomiting. The wife denies any recent traveling or camping. She is not aware of any tick bites. She denies history of alcohol or drug abuse.   PCP [facility]   Past Medical History  Diagnosis Date  . Headache     chronic; every other day   History reviewed. No pertinent past surgical history. History reviewed. No pertinent family history. History  Substance Use Topics  . Smoking status: Never Smoker   . Smokeless tobacco: Not on file  . Alcohol Use: No    Review of Systems A complete 10 system review of systems was obtained and all systems are negative except as noted in the HPI and PMH.    Allergies  Review of patient's allergies indicates no known allergies.  Home Medications   Prior to Admission medications   Medication Sig Start Date End Date Taking? Authorizing Provider  ibuprofen (ADVIL,MOTRIN) 200 MG tablet Take 600-800 mg by mouth daily as needed for headache.   Yes Historical Provider, MD   BP  153/87  Pulse 74  Temp(Src) 98.1 F (36.7 C) (Oral)  Resp 18  Ht 6' (1.829 m)  Wt 200 lb (90.719 kg)  BMI 27.12 kg/m2  SpO2 97% Physical Exam  Nursing note and vitals reviewed. Constitutional: He appears well-developed and well-nourished. No distress.  Obtunded, responds to pain, does not follow commands.  HENT:  Head: Normocephalic and atraumatic.  Mouth/Throat: Oropharynx is clear and moist. No oropharyngeal exudate.  No tongue biting  Eyes: Conjunctivae and EOM are normal. Pupils are equal, round, and reactive to light.  Resists eye opening  Neck: Normal range of motion. Neck supple.  No meningismus.  Cardiovascular: Normal rate, regular rhythm, normal heart sounds and intact distal pulses.   No murmur heard. Pulmonary/Chest: Effort normal and breath sounds normal. No respiratory distress.  Abdominal: Soft. There is no tenderness. There is no rebound and no guarding.  Genitourinary:  No incontinence.   Musculoskeletal: Normal range of motion. He exhibits no edema and no tenderness.  Neurological: No cranial nerve deficit. He exhibits normal muscle tone. Coordination normal.  Obtunded, responds to pain, moving all extremities.   Skin: Skin is warm.  Psychiatric: He has a normal mood and affect. His behavior is normal.    ED Course  Procedures DIAGNOSTIC STUDIES: Oxygen Saturation is 96% on RA, normal by my interpretation.    COORDINATION OF CARE: 8:20 AM- Will order head CT, CXR, and lab work. Will  give Ativan and IV fluids. Family advised of plan for treatment and they agrees.  Labs Review Labs Reviewed  COMPREHENSIVE METABOLIC PANEL - Abnormal; Notable for the following:    Glucose, Bld 142 (*)    Creatinine, Ser 1.36 (*)    GFR calc non Af Amer 63 (*)    GFR calc Af Amer 73 (*)    All other components within normal limits  SALICYLATE LEVEL - Abnormal; Notable for the following:    Salicylate Lvl <1.4 (*)    All other components within normal limits  I-STAT  CG4 LACTIC ACID, ED - Abnormal; Notable for the following:    Lactic Acid, Venous 2.44 (*)    All other components within normal limits  I-STAT CHEM 8, ED - Abnormal; Notable for the following:    Creatinine, Ser 1.40 (*)    Glucose, Bld 144 (*)    All other components within normal limits  CBC WITH DIFFERENTIAL  URINALYSIS, ROUTINE W REFLEX MICROSCOPIC  TROPONIN I  URINE RAPID DRUG SCREEN (HOSP PERFORMED)  ETHANOL  ACETAMINOPHEN LEVEL  TROPONIN I  TROPONIN I  TSH  URINALYSIS, ROUTINE W REFLEX MICROSCOPIC  BASIC METABOLIC PANEL    Imaging Review Ct Head Wo Contrast  11/07/2013   CLINICAL DATA:  43 year old male with witnessed seizure at home. Positive loss of consciousness. Initial encounter.  EXAM: CT HEAD WITHOUT CONTRAST  TECHNIQUE: Contiguous axial images were obtained from the base of the skull through the vertex without intravenous contrast.  COMPARISON:  Head CT 05/08/2010.  FINDINGS: Chronic ethmoid sinus mucosal thickening, mildly increased on the right. Other Visualized paranasal sinuses and mastoids are clear. No scalp hematoma. No acute orbit or scalp soft tissue findings identified.  Intermittent mild motion artifact. Normal cerebral volume. No midline shift, ventriculomegaly, mass effect, evidence of mass lesion, intracranial hemorrhage or evidence of cortically based acute infarction. Gray-white matter differentiation is within normal limits throughout the brain. No suspicious intracranial vascular hyperdensity.  IMPRESSION: Stable and normal noncontrast CT appearance of the brain.   Electronically Signed   By: Lars Pinks M.D.   On: 11/07/2013 08:52   Dg Chest Portable 1 View  11/07/2013   CLINICAL DATA:  Loss of consciousness.  EXAM: PORTABLE CHEST - 1 VIEW  COMPARISON:  None.  FINDINGS: Low lung volumes. Bibasilar atelectasis and mild vascular congestion. Heart is normal size. No effusions or acute bony abnormality.  IMPRESSION: Low lung volumes with bibasilar atelectasis.   No acute findings.   Electronically Signed   By: Rolm Baptise M.D.   On: 11/07/2013 08:50     EKG Interpretation   Date/Time:  Monday November 07 2013 07:59:19 EDT Ventricular Rate:  60 PR Interval:  129 QRS Duration: 111 QT Interval:  433 QTC Calculation: 433 R Axis:   72 Text Interpretation:  Sinus rhythm No significant change was found  Confirmed by Wyvonnia Dusky  MD, Phillips Goulette ([id number]) on 11/07/2013 8:03:52 AM      MDM   Final diagnoses:  Postictal state   Witnessed seizure activity while sitting on toilet. No history of seizure. Patient now postictal. No tongue biting or urinary incontinence.  Patient obtunded, protecting airway, does not follow commands. No evidence of trauma.  CT head negative. Labs unremarkable. Normal anion gap of 14. EKG nsr.  Patient remains post ictal in the ED. Moving all extremities spontaneously, following commands intermittently. Protecting airway. Admission d/w Dr. Jerilee Hoh.   I personally performed the services described in this documentation, which was scribed in  my presence. The recorded information has been reviewed and is accurate.    Brandon Essex, MD 11/07/13 [phone]

## 2013-11-07 NOTE — ED Notes (Signed)
Patient brought in by EMS, stating wife found him on the toilet in the bathroom, slumped over and foaming at the mouth, with some twitching. Patient responsive to pain at triage. States patient was complaining of headache upon arrival to home.

## 2013-11-08 ENCOUNTER — Observation Stay (HOSPITAL_COMMUNITY): Payer: 59

## 2013-11-08 ENCOUNTER — Encounter (HOSPITAL_COMMUNITY): Payer: Self-pay | Admitting: Radiology

## 2013-11-08 LAB — BASIC METABOLIC PANEL
Anion gap: 11 (ref 5–15)
BUN: 14 mg/dL (ref 6–23)
CHLORIDE: 106 meq/L (ref 96–112)
CO2: 25 mEq/L (ref 19–32)
Calcium: 8.8 mg/dL (ref 8.4–10.5)
Creatinine, Ser: 1.11 mg/dL (ref 0.50–1.35)
GFR calc non Af Amer: 80 mL/min — ABNORMAL LOW (ref >90)
GLUCOSE: 96 mg/dL (ref 70–99)
POTASSIUM: 4.3 meq/L (ref 3.7–5.3)
SODIUM: 142 meq/L (ref 137–147)

## 2013-11-08 LAB — TSH: TSH: 3.39 u[IU]/mL (ref 0.350–4.500)

## 2013-11-08 MED ORDER — HYDROCODONE-ACETAMINOPHEN 5-325 MG PO TABS: 1.0000 | ORAL_TABLET | Freq: Four times a day (QID) | ORAL | Status: DC | PRN

## 2013-11-08 NOTE — Progress Notes (Signed)
Patient and family received discharge instructions and scripts.  Patient stated that he will call to schedule follow up appointment with PCP and follow up with [facility]. Patient had no further questions/concerns.  Patient was escorted to vehicle via wheelchair by patient advocate.

## 2013-11-08 NOTE — Discharge Summary (Signed)
Patient seen and examined. Agree with above note. Stable for discharge home today in followup with Dr. Merlene Laughter in 3-4 weeks.  Domingo Mend, MD [facility] Pager: [phone]

## 2013-11-08 NOTE — Discharge Summary (Signed)
Physician Discharge Summary  Brandon Rivers NUU:[id number] DOB: [birth year] DOA: 11/07/2013  PCP: Glo Herring., MD  Admit date: 11/07/2013 Discharge date: 11/08/2013  Time spent: 40 minutes  Recommendations for Outpatient Follow-up:  1. Follow up with PCP 1-2 weeks for evaluation of symptoms.  2. Dr Merlene Laughter in 3-4 weeks for evaluation of symptoms and monitoring of chronic headaches and following MRI results.   Discharge Diagnoses:  Principal Problem:   Post-ictal state Active Problems:   Chest pain   Chronic headaches   Acute kidney injury   Seizure   Discharge Condition: stable  Diet recommendation: regular  Filed Weights   11/07/13 0801  Weight: 90.719 kg (200 lb)    History of present illness:  Brandon Rivers is a 43 y.o. male with a past medical history that includes chronic headaches about every other day presented to the emergency department on 10/12 from home via EMS with the chief complaint of loss of consciousness. Information obtained from the wife  at the bedside and she reported that her husband was in the bathroom and called out her name. She got to the bathroom he was "staring straight forward, would not answer me and then he slumped over". She reported some foaming at the mouth and generalized whole body "twitching". She stated the episode was very brief and that she and her sons assisted him to the floor. During this time he indicated that his chest hurt. There was no report of any urinary or bowel incontinence. Wife denied any recent illness fever, nausea vomiting diarrhea. She stated he had been eating and drinking his normal amount. No history of similar episodes or seizure activity.  Workup in the emergency department included a CT of the head that was unremarkable, chest x-ray  that was unremarkable, basic metabolic panel with a creatinine of 1.4, complete blood count  unremarkable, troponin negative, lactic acid 2.4, urinalysis was unremarkable urine drug  screen was unremarkable and EKG that showed a normal sinus rhythm.  Emergency department he was hemodynamically stable afebrile not hypoxic. He was obtunded but did respond to pain and moving all extremities spontaneously. In the emergency department he was given 1 L of normal saline intravenously and Ativan 1 mg intravenously.  At the time of my exam he opened his eyes briefly to verbal stimuli and attempted to answer yes no questions with a nod of the head. Otherwise he did not follow commands.   Hospital Course:  Post-ictal state: From presumed seizure. No history of same. Admitted to tele. No further episodes. CT head and EEG unremarkable. MRI brain with cerebellar tonsilar ectopia, no mass, no infarct, no fluid.  Evaluated by Dr Merlene Laughter who recommended follow up in 4 weeks.  No focal deficits. At discharge alert and oriented with steady gait.   Active Problems:   Seizure: No history of seizures. Does have a long history of chronic headaches about every other day for which he takes Advil. CT of the head unremarkable. Urine drug screen unremarkable. No indication of any infectious process or metabolic abnormalities. EEG within the limits of normal. Follow MRI with cerebellar tonsillar ectopia. Discussed with Dr. Merlene Laughter who opined may be cause of chronic headaches and recommended follow up in 4 weeks.    Chest pain: Reportedly patient complained of anterior chest pain. Troponin negative x 3. EKG with normal sinus rhythm.    Acute kidney injury: Creatinine 1.4 on admission. Resolved at discharge. Urinalysis unremarkable.    Chronic headaches: History of same. Some photophobia  associated. MRI   Recommend OP follow up.  Pain medicine as indicated. See #1 and #2    Procedures:  EEG 11/07/13. Within the limits of normal  Consultations:  Dr Merlene Laughter  Discharge Exam: Danley Danker Vitals:   11/08/13 0535  BP: 125/83  Pulse: 77  Temp:   Resp: 16    General: well nourished appears  comfortable Cardiovascular: RRR No MGR No LE edema PPP Respiratory: normal effort BS clear bilaterally no wheeze  Discharge Instructions You were cared for by a hospitalist during your hospital stay. If you have any questions about your discharge medications or the care you received while you were in the hospital after you are discharged, you can call the unit and asked to speak with the hospitalist on call if the hospitalist that took care of you is not available. Once you are discharged, your primary care physician will handle any further medical issues. Please note that NO REFILLS for any discharge medications will be authorized once you are discharged, as it is imperative that you return to your primary care physician (or establish a relationship with a primary care physician if you do not have one) for your aftercare needs so that they can reassess your need for medications and monitor your lab values.  Discharge Instructions   Diet - low sodium heart healthy    Complete by:  As directed      Discharge instructions    Complete by:  As directed   Call Dr Cristi Loron office for appointment 3-4 weeks     Increase activity slowly    Complete by:  As directed           Current Discharge Medication List    START taking these medications   Details  HYDROcodone-acetaminophen (NORCO/VICODIN) 5-325 MG per tablet Take 1-2 tablets by mouth every 6 (six) hours as needed for moderate pain. Qty: 6 tablet, Refills: 0      CONTINUE these medications which have NOT CHANGED   Details  ibuprofen (ADVIL,MOTRIN) 200 MG tablet Take 600-800 mg by mouth daily as needed for headache.       No Known Allergies Follow-up Information   Follow up with Glo Herring., MD. Schedule an appointment as soon as possible for a visit in 2 weeks. (for evaluation of symptoms)    Specialty:  Internal Medicine   Contact information:   [address] [address] [phone]       Follow up with  Phillips Odor, MD On 11/29/2013. (at 8:45am for evaluation of symptoms)    Specialty:  Neurology   Contact information:   [address] [place] [address] [phone]        The results of significant diagnostics from this hospitalization (including imaging, microbiology, ancillary and laboratory) are listed below for reference.    Significant Diagnostic Studies: Ct Head Wo Contrast  11/07/2013   CLINICAL DATA:  43 year old male with witnessed seizure at home. Positive loss of consciousness. Initial encounter.  EXAM: CT HEAD WITHOUT CONTRAST  TECHNIQUE: Contiguous axial images were obtained from the base of the skull through the vertex without intravenous contrast.  COMPARISON:  Head CT 05/08/2010.  FINDINGS: Chronic ethmoid sinus mucosal thickening, mildly increased on the right. Other Visualized paranasal sinuses and mastoids are clear. No scalp hematoma. No acute orbit or scalp soft tissue findings identified.  Intermittent mild motion artifact. Normal cerebral volume. No midline shift, ventriculomegaly, mass effect, evidence of mass lesion, intracranial hemorrhage or evidence of cortically based acute  infarction. Gray-white matter differentiation is within normal limits throughout the brain. No suspicious intracranial vascular hyperdensity.  IMPRESSION: Stable and normal noncontrast CT appearance of the brain.   Electronically Signed   By: Lars Pinks M.D.   On: 11/07/2013 08:52   Dg Chest Portable 1 View  11/07/2013   CLINICAL DATA:  Loss of consciousness.  EXAM: PORTABLE CHEST - 1 VIEW  COMPARISON:  None.  FINDINGS: Low lung volumes. Bibasilar atelectasis and mild vascular congestion. Heart is normal size. No effusions or acute bony abnormality.  IMPRESSION: Low lung volumes with bibasilar atelectasis.  No acute findings.   Electronically Signed   By: Rolm Baptise M.D.   On: 11/07/2013 08:50    Microbiology: No results found for this or any previous visit (from the past 240  hour(s)).   Labs: Basic Metabolic Panel:  Recent Labs Lab 11/07/13 0827 11/07/13 0834 11/08/13 0543  NA 138 138 142  K 3.8 3.9 4.3  CL 101 105 106  CO2 24  --  25  GLUCOSE 142* 144* 96  BUN 22 21 14   CREATININE 1.36* 1.40* 1.11  CALCIUM 9.2  --  8.8   Liver Function Tests:  Recent Labs Lab 11/07/13 0827  AST 17  ALT 23  ALKPHOS 52  BILITOT 0.4  PROT 7.0  ALBUMIN 3.8   No results found for this basename: LIPASE, AMYLASE,  in the last 168 hours No results found for this basename: AMMONIA,  in the last 168 hours CBC:  Recent Labs Lab 11/07/13 0827 11/07/13 0834  WBC 7.2  --   NEUTROABS 4.9  --   HGB 15.5 16.3  HCT 44.3 48.0  MCV 85.5  --   PLT 182  --    Cardiac Enzymes:  Recent Labs Lab 11/07/13 0827 11/07/13 1540 11/07/13 2149  TROPONINI <0.30 <0.30 <0.30   BNP: BNP (last 3 results) No results found for this basename: PROBNP,  in the last 8760 hours CBG: No results found for this basename: GLUCAP,  in the last 168 hours     Signed:  Radene Gunning  [facility] 11/08/2013, 2:00 PM

## 2013-11-08 NOTE — Progress Notes (Signed)
Ambulated patient hallway. Slow steady gait. Tolerated well. No complaints voiced at this time

## 2018-09-30 ENCOUNTER — Emergency Department (HOSPITAL_COMMUNITY): Payer: BC Managed Care – PPO

## 2018-09-30 ENCOUNTER — Encounter (HOSPITAL_COMMUNITY): Payer: Self-pay

## 2018-09-30 ENCOUNTER — Other Ambulatory Visit: Payer: Self-pay

## 2018-09-30 ENCOUNTER — Observation Stay (HOSPITAL_COMMUNITY)
Admission: EM | Admit: 2018-09-30 | Discharge: 2018-10-02 | Disposition: A | Discharge status: Home / Self Care | Payer: BC Managed Care – PPO | Attending: General Surgery | Admitting: General Surgery

## 2018-09-30 DIAGNOSIS — K8000 Calculus of gallbladder with acute cholecystitis without obstruction: Secondary | ICD-10-CM

## 2018-09-30 DIAGNOSIS — K8012 Calculus of gallbladder with acute and chronic cholecystitis without obstruction: Principal | ICD-10-CM | POA: Insufficient documentation

## 2018-09-30 DIAGNOSIS — K660 Peritoneal adhesions (postprocedural) (postinfection): Secondary | ICD-10-CM | POA: Diagnosis not present

## 2018-09-30 DIAGNOSIS — K810 Acute cholecystitis: Secondary | ICD-10-CM

## 2018-09-30 DIAGNOSIS — R109 Unspecified abdominal pain: Secondary | ICD-10-CM | POA: Diagnosis present

## 2018-09-30 DIAGNOSIS — R1011 Right upper quadrant pain: Secondary | ICD-10-CM

## 2018-09-30 HISTORY — DX: Malignant (primary) neoplasm, unspecified: C80.1

## 2018-09-30 LAB — CBC WITH DIFFERENTIAL/PLATELET
Abs Immature Granulocytes: 0.02 10*3/uL (ref 0.00–0.07)
Basophils Absolute: 0 10*3/uL (ref 0.0–0.1)
Basophils Relative: 1 %
Eosinophils Absolute: 0.2 10*3/uL (ref 0.0–0.5)
Eosinophils Relative: 2 %
HCT: 47.2 % (ref 39.0–52.0)
Hemoglobin: 15.6 g/dL (ref 13.0–17.0)
Immature Granulocytes: 0 %
Lymphocytes Relative: 27 %
Lymphs Abs: 1.8 10*3/uL (ref 0.7–4.0)
MCH: 29.5 pg (ref 26.0–34.0)
MCHC: 33.1 g/dL (ref 30.0–36.0)
MCV: 89.2 fL (ref 80.0–100.0)
Monocytes Absolute: 0.6 10*3/uL (ref 0.1–1.0)
Monocytes Relative: 8 %
Neutro Abs: 4.1 10*3/uL (ref 1.7–7.7)
Neutrophils Relative %: 62 %
Platelets: 196 10*3/uL (ref 150–400)
RBC: 5.29 MIL/uL (ref 4.22–5.81)
RDW: 11.8 % (ref 11.5–15.5)
WBC: 6.7 10*3/uL (ref 4.0–10.5)
nRBC: 0 % (ref 0.0–0.2)

## 2018-09-30 LAB — URINALYSIS, ROUTINE W REFLEX MICROSCOPIC
Bacteria, UA: NONE SEEN
Bilirubin Urine: NEGATIVE
Glucose, UA: NEGATIVE mg/dL
Ketones, ur: NEGATIVE mg/dL
Leukocytes,Ua: NEGATIVE
Nitrite: NEGATIVE
Protein, ur: NEGATIVE mg/dL
Specific Gravity, Urine: 1.02 (ref 1.005–1.030)
pH: 5 (ref 5.0–8.0)

## 2018-09-30 LAB — COMPREHENSIVE METABOLIC PANEL
ALT: 23 U/L (ref 0–44)
AST: 20 U/L (ref 15–41)
Albumin: 4.1 g/dL (ref 3.5–5.0)
Alkaline Phosphatase: 51 U/L (ref 38–126)
Anion gap: 8 (ref 5–15)
BUN: 18 mg/dL (ref 6–20)
CO2: 25 mmol/L (ref 22–32)
Calcium: 9.1 mg/dL (ref 8.9–10.3)
Chloride: 105 mmol/L (ref 98–111)
Creatinine, Ser: 1.35 mg/dL — ABNORMAL HIGH (ref 0.61–1.24)
GFR calc Af Amer: >60 mL/min (ref >60)
GFR calc non Af Amer: >60 mL/min (ref >60)
Glucose, Bld: 104 mg/dL — ABNORMAL HIGH (ref 70–99)
Potassium: 3.9 mmol/L (ref 3.5–5.1)
Sodium: 138 mmol/L (ref 135–145)
Total Bilirubin: 0.3 mg/dL (ref 0.3–1.2)
Total Protein: 6.9 g/dL (ref 6.5–8.1)

## 2018-09-30 LAB — LIPASE, BLOOD: Lipase: 30 U/L (ref 11–51)

## 2018-09-30 LAB — SARS CORONAVIRUS 2 BY RT PCR (HOSPITAL ORDER, PERFORMED IN ~~LOC~~ HOSPITAL LAB): SARS Coronavirus 2: NEGATIVE

## 2018-09-30 MED ORDER — OXYCODONE HCL 5 MG PO TABS
5.0000 mg | ORAL_TABLET | ORAL | Status: DC | PRN
  Administered 2018-09-30 – 2018-10-01 (×6): 10 mg via ORAL
  Administered 2018-10-01 – 2018-10-02 (×2): 5 mg via ORAL
  Filled 2018-09-30 (×6): qty 2
  Filled 2018-09-30: qty 1
  Filled 2018-09-30: qty 2

## 2018-09-30 MED ORDER — MORPHINE SULFATE (PF) 4 MG/ML IV SOLN
4.0000 mg | Freq: Once | INTRAVENOUS | Status: AC
  Administered 2018-09-30: 4 mg via INTRAVENOUS
  Filled 2018-09-30: qty 1

## 2018-09-30 MED ORDER — DIPHENHYDRAMINE HCL 12.5 MG/5ML PO ELIX: 12.5000 mg | ORAL_SOLUTION | Freq: Four times a day (QID) | ORAL | Status: DC | PRN

## 2018-09-30 MED ORDER — SIMETHICONE 80 MG PO CHEW: 40.0000 mg | CHEWABLE_TABLET | Freq: Four times a day (QID) | ORAL | Status: DC | PRN

## 2018-09-30 MED ORDER — PIPERACILLIN-TAZOBACTAM 3.375 G IVPB
3.3750 g | Freq: Three times a day (TID) | INTRAVENOUS | Status: DC
  Administered 2018-09-30 (×2): 3.375 g via INTRAVENOUS
  Filled 2018-09-30 (×2): qty 50

## 2018-09-30 MED ORDER — SODIUM CHLORIDE 0.9 % IV SOLN
2.0000 g | INTRAVENOUS | Status: AC
  Administered 2018-10-01: 2 g via INTRAVENOUS
  Filled 2018-09-30 (×2): qty 2

## 2018-09-30 MED ORDER — PIPERACILLIN-TAZOBACTAM 3.375 G IVPB: 3.3750 g | Freq: Four times a day (QID) | INTRAVENOUS | Status: DC

## 2018-09-30 MED ORDER — DOCUSATE SODIUM 100 MG PO CAPS
100.0000 mg | ORAL_CAPSULE | Freq: Two times a day (BID) | ORAL | Status: DC
  Administered 2018-09-30 – 2018-10-02 (×4): 100 mg via ORAL
  Filled 2018-09-30 (×4): qty 1

## 2018-09-30 MED ORDER — ONDANSETRON 4 MG PO TBDP: 4.0000 mg | ORAL_TABLET | Freq: Four times a day (QID) | ORAL | Status: DC | PRN

## 2018-09-30 MED ORDER — PIPERACILLIN-TAZOBACTAM 3.375 G IVPB 30 MIN
3.3750 g | Freq: Once | INTRAVENOUS | Status: AC
  Administered 2018-09-30: 3.375 g via INTRAVENOUS
  Filled 2018-09-30: qty 50

## 2018-09-30 MED ORDER — ACETAMINOPHEN 325 MG PO TABS
650.0000 mg | ORAL_TABLET | Freq: Four times a day (QID) | ORAL | Status: DC | PRN
  Administered 2018-09-30: 650 mg via ORAL
  Filled 2018-09-30: qty 2

## 2018-09-30 MED ORDER — SODIUM CHLORIDE 0.9 % IV BOLUS
1000.0000 mL | Freq: Once | INTRAVENOUS | Status: AC
  Administered 2018-09-30: 06:00:00 1000 mL via INTRAVENOUS

## 2018-09-30 MED ORDER — ONDANSETRON HCL 4 MG/2ML IJ SOLN
4.0000 mg | Freq: Once | INTRAMUSCULAR | Status: AC
  Administered 2018-09-30: 06:00:00 4 mg via INTRAVENOUS
  Filled 2018-09-30: qty 2

## 2018-09-30 MED ORDER — MORPHINE SULFATE (PF) 4 MG/ML IV SOLN
4.0000 mg | Freq: Once | INTRAVENOUS | Status: AC
  Administered 2018-09-30: 09:00:00 4 mg via INTRAVENOUS
  Filled 2018-09-30: qty 1

## 2018-09-30 MED ORDER — CHLORHEXIDINE GLUCONATE CLOTH 2 % EX PADS
6.0000 | MEDICATED_PAD | Freq: Once | CUTANEOUS | Status: AC
  Administered 2018-09-30: 6 via TOPICAL

## 2018-09-30 MED ORDER — METOPROLOL TARTRATE 5 MG/5ML IV SOLN: 5.0000 mg | Freq: Four times a day (QID) | INTRAVENOUS | Status: DC | PRN

## 2018-09-30 MED ORDER — ENOXAPARIN SODIUM 40 MG/0.4ML ~~LOC~~ SOLN: 40.0000 mg | SUBCUTANEOUS | Status: DC

## 2018-09-30 MED ORDER — CHLORHEXIDINE GLUCONATE CLOTH 2 % EX PADS
6.0000 | MEDICATED_PAD | Freq: Once | CUTANEOUS | Status: AC
  Administered 2018-10-01: 6 via TOPICAL

## 2018-09-30 MED ORDER — ENOXAPARIN SODIUM 60 MG/0.6ML ~~LOC~~ SOLN
55.0000 mg | SUBCUTANEOUS | Status: DC
  Administered 2018-09-30: 55 mg via SUBCUTANEOUS
  Filled 2018-09-30: qty 0.6

## 2018-09-30 MED ORDER — LACTATED RINGERS IV SOLN
INTRAVENOUS | Status: DC
  Administered 2018-09-30: 10:00:00 via INTRAVENOUS
  Administered 2018-10-01: 650 mL via INTRAVENOUS
  Administered 2018-10-01 (×2): via INTRAVENOUS

## 2018-09-30 MED ORDER — ONDANSETRON HCL 4 MG/2ML IJ SOLN
4.0000 mg | Freq: Four times a day (QID) | INTRAMUSCULAR | Status: DC | PRN
  Administered 2018-09-30: 4 mg via INTRAVENOUS
  Filled 2018-09-30: qty 2

## 2018-09-30 MED ORDER — DIPHENHYDRAMINE HCL 50 MG/ML IJ SOLN: 12.5000 mg | Freq: Four times a day (QID) | INTRAMUSCULAR | Status: DC | PRN

## 2018-09-30 MED ORDER — MORPHINE SULFATE (PF) 2 MG/ML IV SOLN
2.0000 mg | INTRAVENOUS | Status: DC | PRN
  Administered 2018-10-01: 12:00:00 2 mg via INTRAVENOUS
  Filled 2018-09-30: qty 1

## 2018-09-30 NOTE — ED Triage Notes (Signed)
incrased pain with palpation. Also said that it feels like its going into his back

## 2018-09-30 NOTE — Progress Notes (Signed)
[facility]  Continued pain and precholecystic fluid, concern for early cholecystitis. IV antibiotics. Plan for surgery tomorrow. COVID being ordered now.   Curlene Labrum, MD [facility] [address] [place], [place] [address] [phone] (office)

## 2018-09-30 NOTE — ED Provider Notes (Signed)
[facility] EMERGENCY DEPARTMENT Provider Note   CSN: [id number] Arrival date & time: 09/30/18  [id number]    History   Chief Complaint Chief Complaint  Patient presents with  . Abdominal Pain    HPI KYRUS SPRIGG is a 48 y.o. male.   The history is provided by the patient.  He has history of seizures and comes in complaining of right upper quadrant pain which started about 10 PM after eating some popcorn chicken.  Pain does radiate to the back but not to the chest or shoulder.  He denies nausea or vomiting.  Denies constipation or diarrhea.  He has tried an acids at home without relief.  He has never had pain like this before.  Past Medical History:  Diagnosis Date  . Cancer ([place])   . Headache    chronic; every other day    Patient Active Problem List   Diagnosis Date Noted  . Post-ictal state ([place]) 11/07/2013  . Chest pain 11/07/2013  . Chronic headaches 11/07/2013  . Acute kidney injury ([place]) 11/07/2013  . Seizure ([place]) 11/07/2013    History reviewed. No pertinent surgical history.      Home Medications    Prior to Admission medications   Medication Sig Start Date End Date Taking? Authorizing Provider  HYDROcodone-acetaminophen (NORCO/VICODIN) 5-325 MG per tablet Take 1-2 tablets by mouth every 6 (six) hours as needed for moderate pain. 11/08/13   Black, Lezlie Octave, NP  ibuprofen (ADVIL,MOTRIN) 200 MG tablet Take 600-800 mg by mouth daily as needed for headache.    [provider]    Family History History reviewed. No pertinent family history.  Social History Social History   Tobacco Use  . Smoking status: Never Smoker  . Smokeless tobacco: Never Used  Substance Use Topics  . Alcohol use: No  . Drug use: No     Allergies   Patient has no known allergies.   Review of Systems Review of Systems  All other systems reviewed and are negative.    Physical Exam Updated Vital Signs BP (!) 136/93   Pulse 69   Temp 97.8 F (36.6 C) (Oral)    Resp 18   Ht 6\' 3"  (1.905 m)   Wt 113.4 kg   SpO2 100%   BMI 31.25 kg/m   Physical Exam Vitals signs and nursing note reviewed.    48 year old male, resting comfortably and in no acute distress. Vital signs are significant for elevated blood pressure. Oxygen saturation is 100%, which is normal. Head is normocephalic and atraumatic. PERRLA, EOMI. Oropharynx is clear. Neck is nontender and supple without adenopathy or JVD. Back is nontender and there is no CVA tenderness. Lungs are clear without rales, wheezes, or rhonchi. Chest is nontender. Heart has regular rate and rhythm without murmur. Abdomen is soft, flat, with marked right upper quadrant tenderness and a positive Murphy sign.  There is no rebound or guarding.  There are no masses or hepatosplenomegaly and peristalsis is hypoactive. Extremities have no cyanosis or edema, full range of motion is present. Skin is warm and dry without rash. Neurologic: Mental status is normal, cranial nerves are intact, there are no motor or sensory deficits.  ED Treatments / Results  Labs (all labs ordered are listed, but only abnormal results are displayed) Labs Reviewed  CBC WITH DIFFERENTIAL/PLATELET  COMPREHENSIVE METABOLIC PANEL  LIPASE, BLOOD  URINALYSIS, ROUTINE W REFLEX MICROSCOPIC    EKG EKG Interpretation  Date/Time:  Thursday September 30 2018 05:44:51 EDT  Ventricular Rate:  66 PR Interval:    QRS Duration: 104 QT Interval:  398 QTC Calculation: 417 R Axis:   80 Text Interpretation:  Sinus rhythm Normal ECG When compared with ECG of 11/07/2013, No significant change was found Confirmed by Delora Fuel ([id number]) on 09/30/2018 5:48:14 AM   Radiology No results found.  Procedures Procedures   Medications Ordered in ED Medications  sodium chloride 0.9 % bolus 1,000 mL (has no administration in time range)  ondansetron (ZOFRAN) injection 4 mg (has no administration in time range)  morphine 4 MG/ML injection 4 mg (has no  administration in time range)     Initial Impression / Assessment and Plan / ED Course  I have reviewed the triage vital signs and the nursing notes.  Pertinent labs & imaging results that were available during my care of the patient were reviewed by me and considered in my medical decision making (see chart for details).  Right upper quadrant pain with exam strongly suspicious for cholecystitis.  Other possibilities include pancreatitis, peptic ulcer disease, diverticulitis.  He will be given IV fluids, morphine, ondansetron and will be sent for right upper quadrant ultrasound.  CBC is normal.  Metabolic panel and right upper quadrant ultrasound are pending.  Case is signed out to Dr. Sabra Heck  Final Clinical Impressions(s) / ED Diagnoses   Final diagnoses:  RUQ abdominal pain    ED Discharge Orders    None       Delora Fuel, MD [id number] [phone]

## 2018-09-30 NOTE — ED Notes (Signed)
ED TO INPATIENT HANDOFF REPORT  ED Nurse Name and Phone #: Danyael Alipio [phone]  S Name/Age/Gender Brandon Rivers 48 y.o. male Room/Bed: APA09/APA09  Code Status   Code Status: Full Code  Home/SNF/Other Home Patient oriented to: self, place, time and situation Is this baseline? Yes    Triage Complete: Triage complete  Chief Complaint Abdominal pain  Triage Note Pt c/o abdominal pain that started last night around 11pm. Said that he thought it was gas at first. Nothing helps it. Tried tyl, tums, etc.   incrased pain with palpation. Also said that it feels like its going into his back   Allergies No Known Allergies  Level of Care/Admitting Diagnosis ED Disposition    ED Disposition Condition [place]: [facility] [id number]  Level of Care: Med-Surg [16]  Covid Evaluation: Confirmed COVID Negative  Date Laboratory Confirmed COVID Negative: 09/30/2018  Diagnosis: Acute cholecystitis [575.0.ICD-9-CM]  Admitting Physician: Virl Cagey [id number]  Attending Physician: Virl Cagey [id number]  PT Class (Do Not Modify): Observation [104]  PT Acc Code (Do Not Modify): Observation [10022]       B Medical/Surgery History Past Medical History:  Diagnosis Date  . 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 Headache    chronic; every other day   Past Surgical History:  Procedure Laterality Date  . SOFT TISSUE MASS EXCISION     Leg at age 53      A IV Location/Drains/Wounds Patient Lines/Drains/Airways Status   Active Line/Drains/Airways    Name:   Placement date:   Placement time:   Site:   Days:   Peripheral IV 09/30/18 Right Antecubital   09/30/18    0616    Antecubital   less than 1          Intake/Output Last 24 hours No intake or output data in the 24 hours ending 09/30/18 1335  Labs/Imaging Results for orders placed or performed during the hospital encounter of 09/30/18  (from the past 48 hour(s))  Urinalysis, Routine w reflex microscopic     Status: Abnormal   Collection Time: 09/30/18  6:02 AM  Result Value Ref Range   Color, Urine YELLOW YELLOW   APPearance CLEAR CLEAR   Specific Gravity, Urine 1.020 1.005 - 1.030   pH 5.0 5.0 - 8.0   Glucose, UA NEGATIVE NEGATIVE mg/dL   Hgb urine dipstick SMALL (A) NEGATIVE   Bilirubin Urine NEGATIVE NEGATIVE   Ketones, ur NEGATIVE NEGATIVE mg/dL   Protein, ur NEGATIVE NEGATIVE mg/dL   Nitrite NEGATIVE NEGATIVE   Leukocytes,Ua NEGATIVE NEGATIVE   RBC / HPF 0-5 0 - 5 RBC/hpf   WBC, UA 0-5 0 - 5 WBC/hpf   Bacteria, UA NONE SEEN NONE SEEN   Mucus PRESENT     Comment: Performed at [facility], [address], [place], [place] [address]  Comprehensive metabolic panel     Status: Abnormal   Collection Time: 09/30/18  6:20 AM  Result Value Ref Range   Sodium 138 135 - 145 mmol/L   Potassium 3.9 3.5 - 5.1 mmol/L   Chloride 105 98 - 111 mmol/L   CO2 25 22 - 32 mmol/L   Glucose, Bld 104 (H) 70 - 99 mg/dL   BUN 18 6 - 20 mg/dL   Creatinine, Ser 1.35 (H) 0.61 - 1.24 mg/dL   Calcium 9.1 8.9 - 10.3 mg/dL   Total Protein 6.9 6.5 -  8.1 g/dL   Albumin 4.1 3.5 - 5.0 g/dL   AST 20 15 - 41 U/L   ALT 23 0 - 44 U/L   Alkaline Phosphatase 51 38 - 126 U/L   Total Bilirubin 0.3 0.3 - 1.2 mg/dL   GFR calc non Af Amer >60 >60 mL/min   GFR calc Af Amer >60 >60 mL/min   Anion gap 8 5 - 15    Comment: Performed at [facility], [address], [place], [place] [address]  Lipase, blood     Status: None   Collection Time: 09/30/18  6:20 AM  Result Value Ref Range   Lipase 30 11 - 51 U/L    Comment: Performed at [facility], [address], [place], [place] [address]  CBC with Differential     Status: None   Collection Time: 09/30/18  6:20 AM  Result Value Ref Range   WBC 6.7 4.0 - 10.5 K/uL   RBC 5.29 4.22 - 5.81 MIL/uL   Hemoglobin 15.6 13.0 - 17.0 g/dL   HCT 47.2 39.0 - 52.0 %   MCV 89.2 80.0 - 100.0 fL   MCH 29.5 26.0 -  34.0 pg   MCHC 33.1 30.0 - 36.0 g/dL   RDW 11.8 11.5 - 15.5 %   Platelets 196 150 - 400 K/uL   nRBC 0.0 0.0 - 0.2 %   Neutrophils Relative % 62 %   Neutro Abs 4.1 1.7 - 7.7 K/uL   Lymphocytes Relative 27 %   Lymphs Abs 1.8 0.7 - 4.0 K/uL   Monocytes Relative 8 %   Monocytes Absolute 0.6 0.1 - 1.0 K/uL   Eosinophils Relative 2 %   Eosinophils Absolute 0.2 0.0 - 0.5 K/uL   Basophils Relative 1 %   Basophils Absolute 0.0 0.0 - 0.1 K/uL   Immature Granulocytes 0 %   Abs Immature Granulocytes 0.02 0.00 - 0.07 K/uL    Comment: Performed at [facility], [address] [place], [place] [address]  SARS Coronavirus 2 [facility] order, Performed in [facility] hospital lab) Nasopharyngeal Nasopharyngeal Swab     Status: None   Collection Time: 09/30/18  9:58 AM   Specimen: Nasopharyngeal Swab  Result Value Ref Range   SARS Coronavirus 2 NEGATIVE NEGATIVE    Comment: (NOTE) If result is NEGATIVE SARS-CoV-2 target nucleic acids are NOT DETECTED. The SARS-CoV-2 RNA is generally detectable in upper and lower  respiratory specimens during the acute phase of infection. The lowest  concentration of SARS-CoV-2 viral copies this assay can detect is 250  copies / mL. A negative result does not preclude SARS-CoV-2 infection  and should not be used as the sole basis for treatment or other  patient management decisions.  A negative result may occur with  improper specimen collection / handling, submission of specimen other  than nasopharyngeal swab, presence of viral mutation(s) within the  areas targeted by this assay, and inadequate number of viral copies  (<250 copies / mL). A negative result must be combined with clinical  observations, patient history, and epidemiological information. If result is POSITIVE SARS-CoV-2 target nucleic acids are DETECTED. The SARS-CoV-2 RNA is generally detectable in upper and lower  respiratory specimens dur ing the acute phase of infection.  Positive  results  are indicative of active infection with SARS-CoV-2.  Clinical  correlation with patient history and other diagnostic information is  necessary to determine patient infection status.  Positive results do  not rule out bacterial infection or co-infection with  other viruses. If result is PRESUMPTIVE POSTIVE SARS-CoV-2 nucleic acids MAY BE PRESENT.   A presumptive positive result was obtained on the submitted specimen  and confirmed on repeat testing.  While 2019 novel coronavirus  (SARS-CoV-2) nucleic acids may be present in the submitted sample  additional confirmatory testing may be necessary for epidemiological  and / or clinical management purposes  to differentiate between  SARS-CoV-2 and other Sarbecovirus currently known to infect humans.  If clinically indicated additional testing with an alternate test  methodology [phone]) is advised. The SARS-CoV-2 RNA is generally  detectable in upper and lower respiratory sp ecimens during the acute  phase of infection. The expected result is Negative. Fact Sheet for Patients:  StrictlyIdeas.no Fact Sheet for Healthcare Providers: BankingDealers.co.za This test is not yet approved or cleared by the [place] FDA and has been authorized for detection and/or diagnosis of SARS-CoV-2 by FDA under an Emergency Use Authorization (EUA).  This EUA will remain in effect (meaning this test can be used) for the duration of the COVID-19 declaration under Section 564(b)(1) of the Act, 21 U.S.C. section 360bbb-3(b)(1), unless the authorization is terminated or revoked sooner. Performed at [facility], [address], [place], [place] [address]    US Abdomen Limited  Result Date: 09/30/2018 CLINICAL DATA:  48 year old male with right upper quadrant abdominal pain. EXAM: ULTRASOUND ABDOMEN LIMITED RIGHT UPPER QUADRANT COMPARISON:  CT of the abdomen pelvis dated 12/01/2007. FINDINGS: Gallbladder: There are  several stones in the gallbladder. Top-normal gallbladder wall thickness measuring 3-4 mm. Trace pericholecystic fluid may be present. Positive sonographic sign was reported. Common bile duct: Diameter: 4 mm Liver: Mild diffuse increased liver echogenicity most commonly seen in the setting of fatty infiltration. Superimposed inflammation or fibrosis is not excluded. Portal vein is patent on color Doppler imaging with normal direction of blood flow towards the liver. Other: None. IMPRESSION: Cholelithiasis with findings of possible early acute cholecystitis. A hepatobiliary scintigraphy may provide better evaluation of the gallbladder if there is a high clinical concern for acute cholecystitis . Electronically Signed   By: Anner Crete M.D.   On: 09/30/2018 09:16    Pending Labs Unresulted Labs (From admission, onward)    Start     Ordered   10/01/18 0500  Comprehensive metabolic panel  Tomorrow morning,   R     09/30/18 0937   10/01/18 0500  CBC  Tomorrow morning,   R     09/30/18 0937   09/30/18 0935  HIV antibody (Routine Testing)  Once,   STAT     09/30/18 0937          Vitals/Pain Today's Vitals   09/30/18 1300 09/30/18 1300 09/30/18 1313 09/30/18 1330  BP: 127/88   119/82  Pulse: (!) 58   (!) 54  Resp: 14   11  Temp:      TempSrc:      SpO2: 98%   98%  Weight:      Height:      PainSc:  0-No pain 0-No pain     Isolation Precautions No active isolations  Medications Medications  lactated ringers infusion ( Intravenous New Bag/Given 09/30/18 1011)  oxyCODONE (Oxy IR/ROXICODONE) immediate release tablet 5-10 mg (10 mg Oral Given 09/30/18 1009)  morphine 2 MG/ML injection 2 mg (has no administration in time range)  diphenhydrAMINE (BENADRYL) 12.5 MG/5ML elixir 12.5 mg (has no administration in time range)    Or  diphenhydrAMINE (BENADRYL) injection 12.5 mg (has no administration in  time range)  docusate sodium (COLACE) capsule 100 mg (100 mg Oral Given 09/30/18 1009)   ondansetron (ZOFRAN-ODT) disintegrating tablet 4 mg ( Oral See Alternative 09/30/18 1008)    Or  ondansetron (ZOFRAN) injection 4 mg (4 mg Intravenous Given 09/30/18 1008)  simethicone (MYLICON) chewable tablet 40 mg (has no administration in time range)  metoprolol tartrate (LOPRESSOR) injection 5 mg (has no administration in time range)  enoxaparin (LOVENOX) injection 55 mg (55 mg Subcutaneous Given 09/30/18 1013)  piperacillin-tazobactam (ZOSYN) IVPB 3.375 g (has no administration in time range)  sodium chloride 0.9 % bolus 1,000 mL (0 mLs Intravenous Stopped 09/30/18 0652)  ondansetron (ZOFRAN) injection 4 mg (4 mg Intravenous Given 09/30/18 0617)  morphine 4 MG/ML injection 4 mg (4 mg Intravenous Given 09/30/18 0617)  morphine 4 MG/ML injection 4 mg (4 mg Intravenous Given 09/30/18 0857)  piperacillin-tazobactam (ZOSYN) IVPB 3.375 g (0 g Intravenous Stopped 09/30/18 1045)    Mobility walks Low fall risk   Focused Assessments    R Recommendations: See Admitting Provider Note  Report given to:   Additional Notes:

## 2018-09-30 NOTE — ED Notes (Signed)
Dr Constance Haw in to speak with pt.

## 2018-09-30 NOTE — ED Notes (Signed)
PT given a clear liquid tray for lunch.

## 2018-09-30 NOTE — ED Provider Notes (Signed)
Repeat exam shows that the patient has ongoing right upper quadrant tenderness with mild guarding.  He is non-peritoneal, labs are unremarkable but ultrasound shows signs of cholecystitis.  Discussed with Dr. Constance Haw who will admit   Noemi Chapel, MD 09/30/18 0930

## 2018-09-30 NOTE — H&P (Signed)
[facility] History and Physical  Reason for Referral: Acute cholecystitis  Referring Physician:  Dr. Sabra Heck   Chief Complaint    Abdominal Pain      Brandon Rivers is a 48 y.o. male.  HPI: Brandon Rivers is a very pleasant otherwise healthy 48 yo who presented to the ED with epigastric pain that extended into the RUQ and back after eating popcorn chicken (fried) last night.  He says that he has never had pain like this before, and denies any associated nausea or vomiting. He says that the pain continues. He was evaluated in the ED and found to have normal labs. His pain has continued despite pain medication, and an [place] this AM demonstrated concern for possible early acute cholecystitis.   He does take some ibuprofen/ Alleve, but this is only a few times a week/ not daily.    Past Medical History:  Diagnosis Date  . Cancer [facility])    ?cancer removed from leg at age 76 (no further treatment needed)  . Headache    chronic; every other day    Past Surgical History:  Procedure Laterality Date  . SOFT TISSUE MASS EXCISION     Leg at age 49     Family History  Problem Relation Age of Onset  . Anesthesia problems Neg Hx   No pertinent family history or issues with anesthesia reported.   Social History   Tobacco Use  . Smoking status: Never Smoker  . Smokeless tobacco: Never Used  Substance Use Topics  . Alcohol use: No  . Drug use: No    Medications:  I have reviewed the patient's current medications. Prior to Admission: (Not in a hospital admission)  Scheduled: . docusate sodium  100 mg Oral BID  . enoxaparin (LOVENOX) injection  55 mg Subcutaneous Q24H   Continuous: . lactated ringers 50 mL/hr at 09/30/18 1011  . piperacillin-tazobactam (ZOSYN)  IV     [id number] **OR** diphenhydrAMINE, metoprolol tartrate, morphine injection, ondansetron **OR** ondansetron (ZOFRAN) IV, oxyCODONE, simethicone  No Known Allergies   ROS:  A comprehensive  review of systems was negative except for: Gastrointestinal: positive for abdominal pain  Blood pressure 109/69, pulse (!) 56, temperature 97.9 F (36.6 C), temperature source Oral, resp. rate 12, height 6\' 3"  (1.905 m), weight 113.4 kg, SpO2 96 %. Physical Exam Vitals signs reviewed.  Constitutional:      Appearance: He is well-developed.  HENT:     Head: Normocephalic.  Eyes:     Extraocular Movements: Extraocular movements intact.  Cardiovascular:     Rate and Rhythm: Normal rate and regular rhythm.  Pulmonary:     Effort: Pulmonary effort is normal.  Abdominal:     General: There is no distension.     Palpations: Abdomen is soft.     Tenderness: There is abdominal tenderness in the right upper quadrant and epigastric area.  Skin:    General: Skin is warm and dry.  Neurological:     General: No focal deficit present.     Mental Status: He is alert and oriented to person, place, and time.  Psychiatric:        Mood and Affect: Mood normal.        Behavior: Behavior normal.     Results: Results for orders placed or performed during the hospital encounter of 09/30/18 (from the past 48 hour(s))  Urinalysis, Routine w reflex microscopic     Status: Abnormal   Collection Time: 09/30/18  6:02 AM  Result Value Ref Range   Color, Urine YELLOW YELLOW   APPearance CLEAR CLEAR   Specific Gravity, Urine 1.020 1.005 - 1.030   pH 5.0 5.0 - 8.0   Glucose, UA NEGATIVE NEGATIVE mg/dL   Hgb urine dipstick SMALL (A) NEGATIVE   Bilirubin Urine NEGATIVE NEGATIVE   Ketones, ur NEGATIVE NEGATIVE mg/dL   Protein, ur NEGATIVE NEGATIVE mg/dL   Nitrite NEGATIVE NEGATIVE   Leukocytes,Ua NEGATIVE NEGATIVE   RBC / HPF 0-5 0 - 5 RBC/hpf   WBC, UA 0-5 0 - 5 WBC/hpf   Bacteria, UA NONE SEEN NONE SEEN   Mucus PRESENT     Comment: Performed at [facility], [address], [place], [place] [address]  Comprehensive metabolic panel     Status: Abnormal   Collection Time: 09/30/18  6:20 AM  Result  Value Ref Range   Sodium 138 135 - 145 mmol/L   Potassium 3.9 3.5 - 5.1 mmol/L   Chloride 105 98 - 111 mmol/L   CO2 25 22 - 32 mmol/L   Glucose, Bld 104 (H) 70 - 99 mg/dL   BUN 18 6 - 20 mg/dL   Creatinine, Ser 1.35 (H) 0.61 - 1.24 mg/dL   Calcium 9.1 8.9 - 10.3 mg/dL   Total Protein 6.9 6.5 - 8.1 g/dL   Albumin 4.1 3.5 - 5.0 g/dL   AST 20 15 - 41 U/L   ALT 23 0 - 44 U/L   Alkaline Phosphatase 51 38 - 126 U/L   Total Bilirubin 0.3 0.3 - 1.2 mg/dL   GFR calc non Af Amer >60 >60 mL/min   GFR calc Af Amer >60 >60 mL/min   Anion gap 8 5 - 15    Comment: Performed at [facility], [address], [place], [place] [address]  Lipase, blood     Status: None   Collection Time: 09/30/18  6:20 AM  Result Value Ref Range   Lipase 30 11 - 51 U/L    Comment: Performed at [facility], [address], [place], [place] [address]  CBC with Differential     Status: None   Collection Time: 09/30/18  6:20 AM  Result Value Ref Range   WBC 6.7 4.0 - 10.5 K/uL   RBC 5.29 4.22 - 5.81 MIL/uL   Hemoglobin 15.6 13.0 - 17.0 g/dL   HCT 47.2 39.0 - 52.0 %   MCV 89.2 80.0 - 100.0 fL   MCH 29.5 26.0 - 34.0 pg   MCHC 33.1 30.0 - 36.0 g/dL   RDW 11.8 11.5 - 15.5 %   Platelets 196 150 - 400 K/uL   nRBC 0.0 0.0 - 0.2 %   Neutrophils Relative % 62 %   Neutro Abs 4.1 1.7 - 7.7 K/uL   Lymphocytes Relative 27 %   Lymphs Abs 1.8 0.7 - 4.0 K/uL   Monocytes Relative 8 %   Monocytes Absolute 0.6 0.1 - 1.0 K/uL   Eosinophils Relative 2 %   Eosinophils Absolute 0.2 0.0 - 0.5 K/uL   Basophils Relative 1 %   Basophils Absolute 0.0 0.0 - 0.1 K/uL   Immature Granulocytes 0 %   Abs Immature Granulocytes 0.02 0.00 - 0.07 K/uL    Comment: Performed at [facility], [address] [place], [place] [address]  SARS Coronavirus 2 [facility] order, Performed in [facility] hospital lab) Nasopharyngeal Nasopharyngeal Swab     Status: None   Collection Time: 09/30/18  9:58 AM   Specimen: Nasopharyngeal Swab  Result Value  Ref  Range   SARS Coronavirus 2 NEGATIVE NEGATIVE    Comment: (NOTE) If result is NEGATIVE SARS-CoV-2 target nucleic acids are NOT DETECTED. The SARS-CoV-2 RNA is generally detectable in upper and lower  respiratory specimens during the acute phase of infection. The lowest  concentration of SARS-CoV-2 viral copies this assay can detect is 250  copies / mL. A negative result does not preclude SARS-CoV-2 infection  and should not be used as the sole basis for treatment or other  patient management decisions.  A negative result may occur with  improper specimen collection / handling, submission of specimen other  than nasopharyngeal swab, presence of viral mutation(s) within the  areas targeted by this assay, and inadequate number of viral copies  (<250 copies / mL). A negative result must be combined with clinical  observations, patient history, and epidemiological information. If result is POSITIVE SARS-CoV-2 target nucleic acids are DETECTED. The SARS-CoV-2 RNA is generally detectable in upper and lower  respiratory specimens dur ing the acute phase of infection.  Positive  results are indicative of active infection with SARS-CoV-2.  Clinical  correlation with patient history and other diagnostic information is  necessary to determine patient infection status.  Positive results do  not rule out bacterial infection or co-infection with other viruses. If result is PRESUMPTIVE POSTIVE SARS-CoV-2 nucleic acids MAY BE PRESENT.   A presumptive positive result was obtained on the submitted specimen  and confirmed on repeat testing.  While 2019 novel coronavirus  (SARS-CoV-2) nucleic acids may be present in the submitted sample  additional confirmatory testing may be necessary for epidemiological  and / or clinical management purposes  to differentiate between  SARS-CoV-2 and other Sarbecovirus currently known to infect humans.  If clinically indicated additional testing with an alternate test   methodology [phone]) is advised. The SARS-CoV-2 RNA is generally  detectable in upper and lower respiratory sp ecimens during the acute  phase of infection. The expected result is Negative. Fact Sheet for Patients:  StrictlyIdeas.no Fact Sheet for Healthcare Providers: BankingDealers.co.za This test is not yet approved or cleared by the [place] FDA and has been authorized for detection and/or diagnosis of SARS-CoV-2 by FDA under an Emergency Use Authorization (EUA).  This EUA will remain in effect (meaning this test can be used) for the duration of the COVID-19 declaration under Section 564(b)(1) of the Act, 21 U.S.C. section 360bbb-3(b)(1), unless the authorization is terminated or revoked sooner. Performed at [facility], [address], [place], [place] [address]    Personally reviewed- stones in gallbladder neck, some minor fluid and thickening US Abdomen Limited  Result Date: 09/30/2018 CLINICAL DATA:  48 year old male with right upper quadrant abdominal pain. EXAM: ULTRASOUND ABDOMEN LIMITED RIGHT UPPER QUADRANT COMPARISON:  CT of the abdomen pelvis dated 12/01/2007. FINDINGS: Gallbladder: There are several stones in the gallbladder. Top-normal gallbladder wall thickness measuring 3-4 mm. Trace pericholecystic fluid may be present. Positive sonographic sign was reported. Common bile duct: Diameter: 4 mm Liver: Mild diffuse increased liver echogenicity most commonly seen in the setting of fatty infiltration. Superimposed inflammation or fibrosis is not excluded. Portal vein is patent on color Doppler imaging with normal direction of blood flow towards the liver. Other: None. IMPRESSION: Cholelithiasis with findings of possible early acute cholecystitis. A hepatobiliary scintigraphy may provide better evaluation of the gallbladder if there is a high clinical concern for acute cholecystitis . Electronically Signed   By: Anner Crete  M.D.   On: 09/30/2018 09:16  Assessment & Plan:  Brandon Rivers is a 48 y.o. male with what is looking like acute cholecystitis with continued tenderness and pain.  He denies any nausea/vomiting.  - Admit to observation  - PRN for pain - IS, OOB - HD ok - Clear today, NPO midnight - Zosyn now for cholecystitis - SCDs, lovenox - OR tomorrow for laparoscopic cholecystectomy   All questions were answered to the satisfaction of the patient and family.   Virl Cagey 09/30/2018, 12:36 PM

## 2018-09-30 NOTE — ED Triage Notes (Signed)
Pt c/o abdominal pain that started last night around 11pm. Said that he thought it was gas at first. Nothing helps it. Tried tyl, tums, etc.

## 2018-10-01 ENCOUNTER — Encounter (HOSPITAL_COMMUNITY): Payer: Self-pay | Admitting: *Deleted

## 2018-10-01 ENCOUNTER — Encounter (HOSPITAL_COMMUNITY)
Admission: EM | Disposition: A | Discharge status: Home / Self Care | Payer: Self-pay | Source: Home / Self Care | Attending: Emergency Medicine

## 2018-10-01 ENCOUNTER — Observation Stay (HOSPITAL_COMMUNITY): Payer: BC Managed Care – PPO | Admitting: Anesthesiology

## 2018-10-01 DIAGNOSIS — K8000 Calculus of gallbladder with acute cholecystitis without obstruction: Secondary | ICD-10-CM

## 2018-10-01 HISTORY — PX: CHOLECYSTECTOMY: SHX55

## 2018-10-01 LAB — CBC
HCT: 45.6 % (ref 39.0–52.0)
Hemoglobin: 15 g/dL (ref 13.0–17.0)
MCH: 29.8 pg (ref 26.0–34.0)
MCHC: 32.9 g/dL (ref 30.0–36.0)
MCV: 90.7 fL (ref 80.0–100.0)
Platelets: 182 10*3/uL (ref 150–400)
RBC: 5.03 MIL/uL (ref 4.22–5.81)
RDW: 11.9 % (ref 11.5–15.5)
WBC: 11.3 10*3/uL — ABNORMAL HIGH (ref 4.0–10.5)
nRBC: 0 % (ref 0.0–0.2)

## 2018-10-01 LAB — COMPREHENSIVE METABOLIC PANEL
ALT: 52 U/L — ABNORMAL HIGH (ref 0–44)
AST: 40 U/L (ref 15–41)
Albumin: 3.8 g/dL (ref 3.5–5.0)
Alkaline Phosphatase: 62 U/L (ref 38–126)
Anion gap: 7 (ref 5–15)
BUN: 11 mg/dL (ref 6–20)
CO2: 27 mmol/L (ref 22–32)
Calcium: 8.8 mg/dL — ABNORMAL LOW (ref 8.9–10.3)
Chloride: 103 mmol/L (ref 98–111)
Creatinine, Ser: 1.45 mg/dL — ABNORMAL HIGH (ref 0.61–1.24)
GFR calc Af Amer: >60 mL/min (ref >60)
GFR calc non Af Amer: 57 mL/min — ABNORMAL LOW (ref >60)
Glucose, Bld: 119 mg/dL — ABNORMAL HIGH (ref 70–99)
Potassium: 4.5 mmol/L (ref 3.5–5.1)
Sodium: 137 mmol/L (ref 135–145)
Total Bilirubin: 1.1 mg/dL (ref 0.3–1.2)
Total Protein: 6.4 g/dL — ABNORMAL LOW (ref 6.5–8.1)

## 2018-10-01 LAB — HIV ANTIBODY (ROUTINE TESTING W REFLEX): HIV Screen 4th Generation wRfx: NONREACTIVE

## 2018-10-01 LAB — SURGICAL PCR SCREEN
MRSA, PCR: NEGATIVE
Staphylococcus aureus: NEGATIVE

## 2018-10-01 SURGERY — LAPAROSCOPIC CHOLECYSTECTOMY
Anesthesia: General

## 2018-10-01 MED ORDER — PROPOFOL 10 MG/ML IV BOLUS
INTRAVENOUS | Status: DC | PRN
  Administered 2018-10-01: 200 mg via INTRAVENOUS

## 2018-10-01 MED ORDER — FENTANYL CITRATE (PF) 100 MCG/2ML IJ SOLN
INTRAMUSCULAR | Status: AC
  Filled 2018-10-01: qty 2

## 2018-10-01 MED ORDER — MIDAZOLAM HCL 2 MG/2ML IJ SOLN: 0.5000 mg | Freq: Once | INTRAMUSCULAR | Status: DC | PRN

## 2018-10-01 MED ORDER — HYDROMORPHONE HCL 1 MG/ML IJ SOLN
0.2500 mg | INTRAMUSCULAR | Status: DC | PRN
  Administered 2018-10-01: 0.5 mg via INTRAVENOUS
  Filled 2018-10-01: qty 0.5

## 2018-10-01 MED ORDER — FENTANYL CITRATE (PF) 250 MCG/5ML IJ SOLN
INTRAMUSCULAR | Status: AC
  Filled 2018-10-01: qty 5

## 2018-10-01 MED ORDER — MIDAZOLAM HCL 2 MG/2ML IJ SOLN
INTRAMUSCULAR | Status: AC
  Filled 2018-10-01: qty 2

## 2018-10-01 MED ORDER — LACTATED RINGERS IV SOLN
INTRAVENOUS | Status: DC
  Administered 2018-10-01 (×2): via INTRAVENOUS

## 2018-10-01 MED ORDER — SUGAMMADEX SODIUM 500 MG/5ML IV SOLN
INTRAVENOUS | Status: DC | PRN
  Administered 2018-10-01: 400 mg via INTRAVENOUS

## 2018-10-01 MED ORDER — SUGAMMADEX SODIUM 500 MG/5ML IV SOLN
INTRAVENOUS | Status: AC
  Filled 2018-10-01: qty 5

## 2018-10-01 MED ORDER — ENOXAPARIN SODIUM 60 MG/0.6ML ~~LOC~~ SOLN
55.0000 mg | SUBCUTANEOUS | Status: DC
  Administered 2018-10-01: 55 mg via SUBCUTANEOUS
  Filled 2018-10-01: qty 0.6

## 2018-10-01 MED ORDER — BUPIVACAINE HCL (PF) 0.5 % IJ SOLN
INTRAMUSCULAR | Status: AC
  Filled 2018-10-01: qty 30

## 2018-10-01 MED ORDER — PROMETHAZINE HCL 25 MG/ML IJ SOLN: 6.2500 mg | INTRAMUSCULAR | Status: DC | PRN

## 2018-10-01 MED ORDER — HYDROCODONE-ACETAMINOPHEN 7.5-325 MG PO TABS: 1.0000 | ORAL_TABLET | Freq: Once | ORAL | Status: DC | PRN

## 2018-10-01 MED ORDER — ROCURONIUM BROMIDE 100 MG/10ML IV SOLN
INTRAVENOUS | Status: DC | PRN
  Administered 2018-10-01: 10 mg via INTRAVENOUS
  Administered 2018-10-01: 40 mg via INTRAVENOUS

## 2018-10-01 MED ORDER — HEMOSTATIC AGENTS (NO CHARGE) OPTIME
TOPICAL | Status: DC | PRN
  Administered 2018-10-01: 1 via TOPICAL

## 2018-10-01 MED ORDER — SUCCINYLCHOLINE CHLORIDE 200 MG/10ML IV SOSY
PREFILLED_SYRINGE | INTRAVENOUS | Status: DC | PRN
  Administered 2018-10-01: 130 mg via INTRAVENOUS

## 2018-10-01 MED ORDER — IBUPROFEN 600 MG PO TABS
600.0000 mg | ORAL_TABLET | Freq: Four times a day (QID) | ORAL | Status: DC | PRN
  Administered 2018-10-01: 600 mg via ORAL
  Filled 2018-10-01: qty 1

## 2018-10-01 MED ORDER — BUPIVACAINE HCL (PF) 0.5 % IJ SOLN
INTRAMUSCULAR | Status: DC | PRN
  Administered 2018-10-01: 30 mL

## 2018-10-01 MED ORDER — FENTANYL CITRATE (PF) 100 MCG/2ML IJ SOLN
INTRAMUSCULAR | Status: DC | PRN
  Administered 2018-10-01 (×6): 50 ug via INTRAVENOUS

## 2018-10-01 MED ORDER — SUCCINYLCHOLINE CHLORIDE 200 MG/10ML IV SOSY
PREFILLED_SYRINGE | INTRAVENOUS | Status: AC
  Filled 2018-10-01: qty 10

## 2018-10-01 MED ORDER — SODIUM CHLORIDE 0.9 % IR SOLN
Status: DC | PRN
  Administered 2018-10-01: 1000 mL

## 2018-10-01 MED ORDER — MIDAZOLAM HCL 5 MG/5ML IJ SOLN
INTRAMUSCULAR | Status: DC | PRN
  Administered 2018-10-01: 2 mg via INTRAVENOUS

## 2018-10-01 SURGICAL SUPPLY — 49 items
ADH SKN CLS APL DERMABOND .7 (GAUZE/BANDAGES/DRESSINGS) ×1
APL PRP STRL LF DISP 70% ISPRP (MISCELLANEOUS) ×1
APPLIER CLIP ROT 10 11.4 M/L (STAPLE) ×3
APR CLP MED LRG 11.4X10 (STAPLE) ×1
BAG RETRIEVAL 10 (BASKET) ×1
BAG RETRIEVAL 10MM (BASKET) ×1
BLADE SURG 15 STRL LF DISP TIS (BLADE) ×1 IMPLANT
BLADE SURG 15 STRL SS (BLADE) ×3
CHLORAPREP W/TINT 26 (MISCELLANEOUS) ×3 IMPLANT
CLIP APPLIE ROT 10 11.4 M/L (STAPLE) ×1 IMPLANT
CLOTH BEACON ORANGE TIMEOUT ST (SAFETY) ×3 IMPLANT
COVER LIGHT HANDLE STERIS (MISCELLANEOUS) ×6 IMPLANT
COVER WAND RF STERILE (DRAPES) ×3 IMPLANT
DECANTER SPIKE VIAL GLASS SM (MISCELLANEOUS) ×3 IMPLANT
DERMABOND ADVANCED (GAUZE/BANDAGES/DRESSINGS) ×2
DERMABOND ADVANCED .7 DNX12 (GAUZE/BANDAGES/DRESSINGS) ×1 IMPLANT
ELECT REM PT RETURN 9FT ADLT (ELECTROSURGICAL) ×3
ELECTRODE REM PT RTRN 9FT ADLT (ELECTROSURGICAL) ×1 IMPLANT
FILTER SMOKE EVAC LAPAROSHD (FILTER) ×3 IMPLANT
GLOVE BIO SURGEON STRL SZ 6.5 (GLOVE) ×2 IMPLANT
GLOVE BIO SURGEONS STRL SZ 6.5 (GLOVE) ×1
GLOVE BIOGEL PI IND STRL 6.5 (GLOVE) ×1 IMPLANT
GLOVE BIOGEL PI IND STRL 7.0 (GLOVE) ×3 IMPLANT
GLOVE BIOGEL PI INDICATOR 6.5 (GLOVE) ×2
GLOVE BIOGEL PI INDICATOR 7.0 (GLOVE) ×6
GLOVE ECLIPSE 6.5 STRL STRAW (GLOVE) ×2 IMPLANT
GOWN STRL REUS W/TWL LRG LVL3 (GOWN DISPOSABLE) ×9 IMPLANT
HEMOSTAT SNOW SURGICEL 2X4 (HEMOSTASIS) ×3 IMPLANT
INST SET LAPROSCOPIC AP (KITS) ×3 IMPLANT
KIT TURNOVER KIT A (KITS) ×3 IMPLANT
MANIFOLD NEPTUNE II (INSTRUMENTS) ×3 IMPLANT
NDL INSUFFLATION 14GA 120MM (NEEDLE) ×1 IMPLANT
NEEDLE INSUFFLATION 14GA 120MM (NEEDLE) ×3 IMPLANT
NS IRRIG 1000ML POUR BTL (IV SOLUTION) ×3 IMPLANT
PACK LAP CHOLE LZT030E (CUSTOM PROCEDURE TRAY) ×3 IMPLANT
PAD ARMBOARD 7.5X6 YLW CONV (MISCELLANEOUS) ×3 IMPLANT
SET BASIN LINEN APH (SET/KITS/TRAYS/PACK) ×3 IMPLANT
SLEEVE ENDOPATH XCEL 5M (ENDOMECHANICALS) ×3 IMPLANT
SUT MNCRL AB 4-0 PS2 18 (SUTURE) ×3 IMPLANT
SUT VICRYL 0 UR6 27IN ABS (SUTURE) ×3 IMPLANT
SYS BAG RETRIEVAL 10MM (BASKET) ×1
SYSTEM BAG RETRIEVAL 10MM (BASKET) ×1 IMPLANT
TROCAR ENDO BLADELESS 11MM (ENDOMECHANICALS) ×3 IMPLANT
TROCAR XCEL NON-BLD 5MMX100MML (ENDOMECHANICALS) ×3 IMPLANT
TROCAR XCEL UNIV SLVE 11M 100M (ENDOMECHANICALS) ×3 IMPLANT
TUBE CONNECTING 12'X1/4 (SUCTIONS) ×1
TUBE CONNECTING 12X1/4 (SUCTIONS) ×2 IMPLANT
TUBING INSUFFLATION (TUBING) ×3 IMPLANT
WARMER LAPAROSCOPE (MISCELLANEOUS) ×3 IMPLANT

## 2018-10-01 NOTE — Anesthesia Postprocedure Evaluation (Signed)
Anesthesia Post Note  Patient: Brandon Rivers  Procedure(s) Performed: LAPAROSCOPIC CHOLECYSTECTOMY (N/A )  Patient location during evaluation: PACU Anesthesia Type: General Level of consciousness: awake and alert and oriented Pain management: pain level controlled Vital Signs Assessment: post-procedure vital signs reviewed and stable Respiratory status: spontaneous breathing Cardiovascular status: blood pressure returned to baseline and stable Postop Assessment: no apparent nausea or vomiting Anesthetic complications: no     Last Vitals:  Vitals:   10/01/18 1100 10/01/18 1115  BP: 119/70 125/86  Pulse: 83 81  Resp: 10 12  Temp:    SpO2: 95% 95%    Last Pain:  Vitals:   10/01/18 1115  TempSrc:   PainSc: Asleep                 Zamyia Gowell

## 2018-10-01 NOTE — Progress Notes (Signed)
[facility]  Notified wife, Tammi Klippel that procedure completed. Plan for possible d/c home later today pending how the patient feels.  Curlene Labrum, MD [facility] [address] [place], [place] [address] [phone] (office)

## 2018-10-01 NOTE — Progress Notes (Signed)
Assisted patient up to use restroom, voided independently with no complaints. Ambulated in room and hallway from his room to nurses station and back. Tolerated well, states some incisional discomfort with activity. Assisted up to chair on return to room. Call light and personal items kept within reach. Donavan Foil, RN

## 2018-10-01 NOTE — Transfer of Care (Addendum)
Immediate Anesthesia Transfer of Care Note  Patient: Brandon Rivers  Procedure(s) Performed: LAPAROSCOPIC CHOLECYSTECTOMY (N/A )  Patient Location: PACU  Anesthesia Type:General  Level of Consciousness: awake and alert   Airway & Oxygen Therapy: Patient Spontanous Breathing  Post-op Assessment: Report given to RN  Post vital signs: Reviewed  Last Vitals:  Vitals Value Taken Time  BP 117/89 10/01/18 1030  Temp    Pulse 83 10/01/18 1038  Resp 14 10/01/18 1038  SpO2 95 % 10/01/18 1038  Vitals shown include unvalidated device data.  Last Pain:  Vitals:   10/01/18 0815  TempSrc: Oral  PainSc: 3       Patients Stated Pain Goal: 6 ([id number])  Complications: Previously repaired Lt front tooth chipped off when patient was biting on oral airway, piece of tooth retrieved, and returned to patient.  Called wife and let her know also. Patient also reformed.

## 2018-10-01 NOTE — Op Note (Signed)
Operative Note   Preoperative Diagnosis: Acute cholecystitis    Postoperative Diagnosis: Same   Procedure(s) Performed: Laparoscopic cholecystectomy   Surgeon: Ria Comment C. Constance Haw, MD   Assistants: Aviva Signs, MD    Anesthesia: General endotracheal   Anesthesiologist: Lenice Llamas, MD    Specimens: Gallbladder    Estimated Blood Loss: Minimal    Blood Replacement: None    Complications: None    Operative Findings: Distended, infected gallbladder    Procedure: The patient was taken to the operating room and placed supine. General endotracheal anesthesia was induced. Intravenous antibiotics were administered per protocol. An orogastric tube positioned to decompress the stomach. The abdomen was prepared and draped in the usual sterile fashion.    A supraumbilical incision was made and a Veress technique was utilized to achieve pneumoperitoneum to 15 mmHg with carbon dioxide. A 11 mm optiview port was placed through the supraumbilical region, and a 10 mm 0-degree operative laparoscope was introduced. The area underlying the trocar and Veress needle were inspected and without evidence of injury.  Remaining trocars were placed under direct vision. Two 5 mm ports were placed in the right abdomen, between the anterior axillary and midclavicular line.  A final 11 mm port was placed through the mid-epigastrium, near the falciform ligament.   The gallbladder was inflamed and thickened. The gallbladder was decompressed with suction. The gallbladder was long and full of large stones.  The gallbladder fundus was elevated cephalad and the infundibulum was retracted to the patient's right. In order to get the infundibulum up, the medial and lateral dissection was performed to fre the gallbladder from the liver.  Omental adhesions to the liver and infundibulum were also taken down with cautery, and this allowed the infundibulum to be elevated.  A large stone was in the neck, and was able to be  pushed back with the grasper.  From here the gallbladder/cystic duct junction was skeletonized. A very small cystic artery noted in the triangle of Calot and was also skeletonized.  Dissection was continued until the critical view of safety was achieved.    The cystic duct was triply clipped and divided.  The small cystic artery was doubly clipped and divided.  The rind of the gallbladder was very oozing and peeling off posteriorly above a second vessel that was diving back toward the liver. With careful dissection this vessel was left inferior to the gallbladder, and clips were placed on the rind that was being peeled down due to bleeding, as there were possibly small tributaries from the larger vessel that was left in the hepatic bed.  The gallbladder was then dissected from the liver bed with electrocautery. The specimen was placed in an Endopouch and was retrieved through the epigastric site.   Final inspection revealed acceptable hemostasis. Surgical Emogene Morgan was placed in the gallbladder bed. Trocars were removed and pneumoperitoneum was released. 0 Vicryl fascial sutures were used to close the epigastric site, and the umbilical site was smaller than my finger tip.  Skin incisions were closed with 4-0 Monocryl subcuticular sutures and Dermabond. The patient was awakened from anesthesia and extubated without complication.    Curlene Labrum, MD [facility] [address] [place], [place] [address] [phone] (office)

## 2018-10-01 NOTE — Anesthesia Preprocedure Evaluation (Signed)
Anesthesia Evaluation  Patient identified by MRN, date of birth, ID band Patient awake    Reviewed: Allergy & Precautions, NPO status , Patient's Chart, lab work & pertinent test results  Airway Mallampati: II  TM Distance: >3 FB Neck ROM: Full    Dental no notable dental hx. (+) Teeth Intact   Pulmonary neg pulmonary ROS,    Pulmonary exam normal breath sounds clear to auscultation       Cardiovascular Exercise Tolerance: Good negative cardio ROS Normal cardiovascular examI Rhythm:Regular Rate:Normal     Neuro/Psych  Headaches, neg Seizures Pt denies Sz or Sz meds  Head CT in 2015 reports indication witnessed Sz When discussed with Pt -denies it was a Sz, describes ?sycope -states wasn't worked up, denies any other episodes of Sz or Syncope - no current Sz meds   Reports HA this AM -6/10 pain -offered pain meds if desired negative psych ROS   GI/Hepatic negative GI ROS, Neg liver ROS,   Endo/Other  negative endocrine ROS  Renal/GU Renal InsufficiencyRenal disease  negative genitourinary   Musculoskeletal negative musculoskeletal ROS (+)   Abdominal   Peds negative pediatric ROS (+)  Hematology negative hematology ROS (+)   Anesthesia Other Findings   Reproductive/Obstetrics negative OB ROS                             Anesthesia Physical Anesthesia Plan  ASA: II  Anesthesia Plan: General   Post-op Pain Management:    Induction: Intravenous  PONV Risk Score and Plan: 2 and Dexamethasone, Ondansetron, Treatment may vary due to age or medical condition and Midazolam  Airway Management Planned: Oral ETT  Additional Equipment:   Intra-op Plan:   Post-operative Plan: Extubation in OR  Informed Consent: I have reviewed the patients History and Physical, chart, labs and discussed the procedure including the risks, benefits and alternatives for the proposed anesthesia with the  patient or authorized representative who has indicated his/her understanding and acceptance.     Dental advisory given  Plan Discussed with: CRNA  Anesthesia Plan Comments: (Plan Full PPE use Plan GETA D/W PT -WTP with same after Q&A)        Anesthesia Quick Evaluation

## 2018-10-01 NOTE — Progress Notes (Signed)
Patient c/o incisional pain with movement, pain with deep breathing/coughing. Encouraged deep breathing and IS use and splinting abdomen with pillow for position changes, coughing, sneezing, etc. Demonstrates correct use of pillow to splint abdomen. States pain decreased with morphine and later dose of oxycodone as ordered PRN for pain. Stated he would "like to stay the night." received call from Dr. Constance Haw and updated her on patient status, vitals, pain. Stated okay for him to stay overnight. Patient aware. Donavan Foil, RN

## 2018-10-01 NOTE — Progress Notes (Signed)
Patient states oxycodone as ordered PRN pain has been decreasing incisional pain. Reports headache and states normally takes ibuprofen for headaches rather than tylenol. Discussed with Dr. Constance Haw. Order received for ibuprofen 600 mg PO every 6 hours PRN for headache. Donavan Foil, RN

## 2018-10-01 NOTE — Interval H&P Note (Signed)
History and Physical Interval Note:  10/01/2018 8:24 AM  Brandon Rivers  has presented today for surgery, with the diagnosis of acute cholecystitis.  The various methods of treatment have been discussed with the patient and family. After consideration of risks, benefits and other options for treatment, the patient has consented to  Procedure(s): LAPAROSCOPIC CHOLECYSTECTOMY (N/A) as a surgical intervention.  The patient's history has been reviewed, patient examined, no change in status, stable for surgery.  I have reviewed the patient's chart and labs.  Questions were answered to the patient's satisfaction.    No questions or concerns.  Virl Cagey

## 2018-10-01 NOTE — Anesthesia Procedure Notes (Signed)
Procedure Name: Intubation Date/Time: 10/01/2018 9:11 AM Performed by: Ollen Bowl, CRNA Pre-anesthesia Checklist: Patient identified, Patient being monitored, Timeout performed, Emergency Drugs available and Suction available Patient Re-evaluated:Patient Re-evaluated prior to induction Oxygen Delivery Method: Circle system utilized Preoxygenation: Pre-oxygenation with 100% oxygen Induction Type: IV induction Ventilation: Mask ventilation without difficulty Laryngoscope Size: Mac and 4 Grade View: Grade I Tube type: Oral Tube size: 7.5 mm Number of attempts: 1 Airway Equipment and Method: Stylet Placement Confirmation: ETT inserted through vocal cords under direct vision,  positive ETCO2 and breath sounds checked- equal and bilateral Secured at: 24 cm Tube secured with: Tape Dental Injury: Teeth and Oropharynx as per pre-operative assessment

## 2018-10-01 NOTE — Discharge Instructions (Signed)
Discharge Laparoscopic Surgery Instructions: ° °Common Complaints: °Right shoulder pain is common after laparoscopic surgery. This is secondary to the gas used in the surgery being trapped under the diaphragm.  °Walk to help your body absorb the gas. This will improve in a few days. °Pain at the port sites are common, especially the larger port sites. This will improve with time.  °Some nausea is common and poor appetite. The main goal is to stay hydrated the first few days after surgery.  ° °Diet/ Activity: °Diet as tolerated. You may not have an appetite, but it is important to stay hydrated. Drink 64 ounces of water a day. Your appetite will return with time.  °Shower per your regular routine daily.  Do not take hot showers. Take warm showers that are less than 10 minutes. °Rest and listen to your body, but do not remain in bed all day.  °Walk everyday for at least 15-20 minutes. Deep cough and move around every 1-2 hours in the first few days after surgery.  °Do not lift > 10 lbs, perform excessive bending, pushing, pulling, squatting for 1-2 weeks after surgery.  °Do not pick at the dermabond glue on your incision sites.  This glue film will remain in place for 1-2 weeks and will start to peel off.  °Do not place lotions or balms on your incision unless instructed to specifically by Dr. Kenyada Hy.  ° °Medication: °Take tylenol and ibuprofen as needed for pain control, alternating every 4-6 hours.  °Example:  °Tylenol 1000mg @ 6am, 12noon, 6pm, 12midnight (Do not exceed 4000mg of tylenol a day). Ibuprofen 800mg @ 9am, 3pm, 9pm, 3am (Do not exceed 3600mg of ibuprofen a day).  °Take Roxicodone for breakthrough pain every 4 hours.  °Take Colace for constipation related to narcotic pain medication. If you do not have a bowel movement in 2 days, take Miralax over the counter.  °Drink plenty of water to also prevent constipation.  ° °Contact Information: °If you have questions or concerns, please call our office,  [phone], Monday- Thursday 8AM-5PM and Friday 8AM-12Noon.  °If it is after hours or on the weekend, please call [facility] Main Number, [phone], and ask to speak to the surgeon on call for Dr. Shanedra Lave at [place].  ° ° °Laparoscopic Cholecystectomy, Care After °This sheet gives you information about how to care for yourself after your procedure. Your doctor may also give you more specific instructions. If you have problems or questions, contact your doctor. °Follow these instructions at home: °Care for cuts from surgery (incisions) ° °· Follow instructions from your doctor about how to take care of your cuts from surgery. Make sure you: °? Wash your hands with soap and water before you change your bandage (dressing). If you cannot use soap and water, use hand sanitizer. °? Change your bandage as told by your doctor. °? Leave stitches (sutures), skin glue, or skin tape (adhesive) strips in place. They may need to stay in place for 2 weeks or longer. If tape strips get loose and curl up, you may trim the loose edges. Do not remove tape strips completely unless your doctor says it is okay. °· Do not take baths, swim, or use a hot tub until your doctor says it is okay.  °· You may shower. °· Check your surgical cut area every day for signs of infection. Check for: °? More redness, swelling, or pain. °? More fluid or blood. °? Warmth. °? Pus or a bad smell. °Activity °· Do   not drive or use heavy machinery while taking prescription pain medicine. °· Do not lift anything that is heavier than 10 lb (4.5 kg) until your doctor says it is okay. °· Do not play contact sports until your doctor says it is okay. °· Do not drive for 24 hours if you were given a medicine to help you relax (sedative). °· Rest as needed. Do not return to work or school until your doctor says it is okay. °General instructions °· Take over-the-counter and prescription medicines only as told by your doctor. °· To prevent or treat constipation  while you are taking prescription pain medicine, your doctor may recommend that you: °? Drink enough fluid to keep your pee (urine) clear or pale yellow. °? Take over-the-counter or prescription medicines. °? Eat foods that are high in fiber, such as fresh fruits and vegetables, whole grains, and beans. °? Limit foods that are high in fat and processed sugars, such as fried and sweet foods. °Contact a doctor if: °· You develop a rash. °· You have more redness, swelling, or pain around your surgical cuts. °· You have more fluid or blood coming from your surgical cuts. °· Your surgical cuts feel warm to the touch. °· You have pus or a bad smell coming from your surgical cuts. °· You have a fever. °· One or more of your surgical cuts breaks open. °Get help right away if: °· You have trouble breathing. °· You have chest pain. °· You have pain that is getting worse in your shoulders. °· You faint or feel dizzy when you stand. °· You have very bad pain in your belly (abdomen). °· You are sick to your stomach (nauseous) for more than one day. °· You have throwing up (vomiting) that lasts for more than one day. °· You have leg pain. °This information is not intended to replace advice given to you by your health care provider. Make sure you discuss any questions you have with your health care provider. °Document Released: 10/23/2007 Document Revised: 12/26/2016 Document Reviewed: 07/02/2015 °Elsevier Patient Education © 2020 [employer] ° °

## 2018-10-02 MED ORDER — DOCUSATE SODIUM 100 MG PO CAPS: 100.0000 mg | ORAL_CAPSULE | Freq: Two times a day (BID) | ORAL | 1 refills | Status: DC

## 2018-10-02 MED ORDER — OXYCODONE HCL 5 MG PO TABS: 5.0000 mg | ORAL_TABLET | ORAL | 0 refills | Status: AC | PRN

## 2018-10-02 MED ORDER — ONDANSETRON 4 MG PO TBDP: 4.0000 mg | ORAL_TABLET | Freq: Four times a day (QID) | ORAL | 0 refills | Status: AC | PRN

## 2018-10-02 MED ORDER — DOCUSATE SODIUM 100 MG PO CAPS: 100.0000 mg | ORAL_CAPSULE | Freq: Two times a day (BID) | ORAL | 1 refills | Status: AC

## 2018-10-02 MED ORDER — ONDANSETRON 4 MG PO TBDP: 4.0000 mg | ORAL_TABLET | Freq: Four times a day (QID) | ORAL | 0 refills | Status: DC | PRN

## 2018-10-02 MED ORDER — OXYCODONE HCL 5 MG PO TABS: 5.0000 mg | ORAL_TABLET | ORAL | 0 refills | Status: DC | PRN

## 2018-10-02 NOTE — Progress Notes (Signed)
Nsg Discharge Note  Admit Date:  09/30/2018 Discharge date: 10/02/2018   MARLON DEGRAAF to be D/C'd Home per MD order.  AVS completed.  Patient able to verbalize understanding.  Discharge Medication: Allergies as of 10/02/2018   No Known Allergies     Medication List    STOP taking these medications   HYDROcodone-acetaminophen 5-325 MG tablet Commonly known as: NORCO/VICODIN     TAKE these medications   docusate sodium 100 MG capsule Commonly known as: COLACE Take 1 capsule (100 mg total) by mouth 2 (two) times daily.   ibuprofen 200 MG tablet Commonly known as: ADVIL Take 600-800 mg by mouth daily as needed for headache.   ondansetron 4 MG disintegrating tablet Commonly known as: ZOFRAN-ODT Take 1 tablet (4 mg total) by mouth every 6 (six) hours as needed for nausea.   oxyCODONE 5 MG immediate release tablet Commonly known as: Oxy IR/ROXICODONE Take 1 tablet (5 mg total) by mouth every 4 (four) hours as needed for severe pain or breakthrough pain.       Discharge Assessment: Vitals:   10/02/18 0242 10/02/18 0644  BP: 136/82 122/86  Pulse: 78 79  Resp: 16 16  Temp: 98.5 F (36.9 C) 98.1 F (36.7 C)  SpO2: 92% 96%   Skin clean, dry and intact without evidence of skin break down, no evidence of skin tears noted. IV catheter discontinued intact. Site without signs and symptoms of complications - no redness or edema noted at insertion site, patient denies c/o pain - only slight tenderness at site.  Dressing with slight pressure applied.  D/c Instructions-Education: Discharge instructions given to patient with verbalized understanding. D/c education completed with patient including follow up instructions, medication list, d/c activities limitations if indicated, with other d/c instructions as indicated by MD - patient able to verbalize understanding, all questions fully answered. Patient instructed to return to ED, call 911, or call MD for any changes in condition.   Patient escorted via [place], and D/C home via private auto.  Elodia Florence, RN 10/02/2018 10:58 AM

## 2018-10-02 NOTE — Discharge Summary (Signed)
Physician Discharge Summary  Patient ID: Brandon Rivers MRN: [id number] DOB/AGE: October 29, 1970 48 y.o.  Admit date: 09/30/2018 Discharge date: 10/02/2018  Admission Diagnoses: Acute cholecystitis   Discharge Diagnoses:  Active Problems:   Acute cholecystitis due to biliary calculus   Discharged Condition: good  Hospital Course: Mr. Slatten is a 48 yo with a history of acute onset of RUQ pain that was found to have acute cholecystitis on [place]. He was brought into the hospital and placed on antibiotics. His gallbladder was removed 10/01/2018. He had some issues with pain control after surgery, and opted to stay overnight to get this improved.   This AM he has been eating, walking, and his pain is controlled with oral meds. He asked if he could be discharged prior to be arriving, and I felt that this was appropriate since I was going to discharge home after surgery yesterday.   Consults: None  Significant Diagnostic Studies: [place]- Acute cholecystitis   Treatments: 10/01/2018 Laparoscopic cholecystectomy, IV antibiotics and pain medication   Discharge Exam: Blood pressure 122/86, pulse 79, temperature 98.1 F (36.7 C), resp. rate 16, height 6\' 3"  (1.905 m), weight 113 kg, SpO2 96 %.   Disposition: Discharge disposition: 01-Home or Self Care       Discharge Instructions    Call MD for:  difficulty breathing, headache or visual disturbances   Complete by: As directed    Call MD for:  extreme fatigue   Complete by: As directed    Call MD for:  persistant dizziness or light-headedness   Complete by: As directed    Call MD for:  persistant nausea and vomiting   Complete by: As directed    Call MD for:  redness, tenderness, or signs of infection (pain, swelling, redness, odor or green/yellow discharge around incision site)   Complete by: As directed    Call MD for:  severe uncontrolled pain   Complete by: As directed    Call MD for:  temperature >100.4   Complete by: As directed    Increase activity slowly   Complete by: As directed      Allergies as of 10/02/2018   No Known Allergies     Medication List    STOP taking these medications   HYDROcodone-acetaminophen 5-325 MG tablet Commonly known as: NORCO/VICODIN     TAKE these medications   docusate sodium 100 MG capsule Commonly known as: COLACE Take 1 capsule (100 mg total) by mouth 2 (two) times daily.   ibuprofen 200 MG tablet Commonly known as: ADVIL Take 600-800 mg by mouth daily as needed for headache.   ondansetron 4 MG disintegrating tablet Commonly known as: ZOFRAN-ODT Take 1 tablet (4 mg total) by mouth every 6 (six) hours as needed for nausea.   oxyCODONE 5 MG immediate release tablet Commonly known as: Oxy IR/ROXICODONE Take 1 tablet (5 mg total) by mouth every 4 (four) hours as needed for severe pain or breakthrough pain.      Follow-up Information    Virl Cagey, MD On 10/14/2018.   Specialty: General Surgery Why: Dr. Constance Haw will call you on 9/17 in the afternoon to check on you. If you need an inperson appointment, please call the office.  Contact information: [address] [place] [address] [phone]           Signed: Virl Cagey 10/02/2018, 1:27 PM

## 2018-10-05 ENCOUNTER — Encounter (HOSPITAL_COMMUNITY): Payer: Self-pay | Admitting: General Surgery

## 2018-10-14 ENCOUNTER — Other Ambulatory Visit: Payer: Self-pay

## 2018-10-14 ENCOUNTER — Telehealth: Payer: Self-pay | Admitting: General Surgery

## 2018-10-18 NOTE — Progress Notes (Signed)
[facility]  I am calling the patient for post operative evaluation due to the current COVID 19 pandemic.  The patient had a laparoscopic cholecystectomy on 10/01/18. The patient reports that they are doing well overall. He is back at work. His abdomen has been sore. But he is able to do more stuff. The are tolerating a diet, having good pain control, and having regular Bms.  The patient has no concerns. Incisions are healing but some bruising, the dermabond is starting to peel.  Will see the patient PRN.   Pathology: Diagnosis Gallbladder - ACUTE AND CHRONIC CHOLECYSTITIS AND CHOLELITHIASIS. - ATTACHED HEPATIC PARENCHYMA. - THERE IS NO EVIDENCE OF MALIGNANCY.  Curlene Labrum, MD [facility] [address] [place], [place] [address] [phone] (office)

## 2019-11-30 ENCOUNTER — Ambulatory Visit
Admission: RE | Admit: 2019-11-30 | Discharge: 2019-11-30 | Disposition: A | Discharge status: Home / Self Care | Payer: BC Managed Care – PPO | Source: Ambulatory Visit | Attending: Emergency Medicine | Admitting: Emergency Medicine

## 2019-11-30 ENCOUNTER — Other Ambulatory Visit: Payer: Self-pay

## 2019-11-30 VITALS — BP 146/84 | HR 88 | Temp 98.0°F | Resp 19 | Ht 72.0 in | Wt 250.0 lb

## 2019-11-30 DIAGNOSIS — J069 Acute upper respiratory infection, unspecified: Secondary | ICD-10-CM | POA: Diagnosis not present

## 2019-11-30 MED ORDER — BENZONATATE 100 MG PO CAPS: 100.0000 mg | ORAL_CAPSULE | Freq: Three times a day (TID) | ORAL | 0 refills | Status: AC

## 2019-11-30 MED ORDER — BENZONATATE 100 MG PO CAPS: 100.0000 mg | ORAL_CAPSULE | Freq: Three times a day (TID) | ORAL | 0 refills | Status: DC

## 2019-11-30 NOTE — ED Triage Notes (Signed)
Coughing and chest congestion that started last night.  Had neg at home covid test.

## 2019-11-30 NOTE — ED Provider Notes (Signed)
[place]   [id number] 11/30/19 Arrival Time: 3244   CC: COVID symptoms  SUBJECTIVE: History from: patient.  Brandon Rivers is a 49 y.o. male who presents with cough and chest congestion x 1 day.  Wife with similar symptoms.  Had a negative at home covid test.  Denies alleviating or aggravating factors.  Denies previous COVID infection in the past.   Denies fever, chills,  sore throat, SOB, wheezing, chest pain, nausea, changes in bowel or bladder habits.    ROS: As per HPI.  All other pertinent ROS negative.     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   Current Outpatient Medications on File Prior to Encounter  Medication Sig Dispense Refill   docusate sodium (COLACE) 100 MG capsule Take 1 capsule (100 mg total) by mouth 2 (two) times daily. 60 capsule 1   ibuprofen (ADVIL,MOTRIN) 200 MG tablet Take 600-800 mg by mouth daily as needed for headache.     ondansetron (ZOFRAN-ODT) 4 MG disintegrating tablet Take 1 tablet (4 mg total) by mouth every 6 (six) hours as needed for nausea. 20 tablet 0   oxyCODONE (OXY IR/ROXICODONE) 5 MG immediate release tablet Take 1 tablet (5 mg total) by mouth every 4 (four) hours as needed for severe pain or breakthrough pain. 30 tablet 0   Social History   Socioeconomic History   Marital status: Single    Spouse name: Not on file   Number of children: Not on file   Years of  education: Not on file   Highest education level: Not on file  Occupational History   Not on file  Tobacco Use   Smoking status: Never Smoker   Smokeless tobacco: Never Used  Substance and Sexual Activity   Alcohol use: Yes    Comment: occ   Drug use: No   Sexual activity: Not on file  Other Topics Concern   Not on file  Social History Narrative   Not on file   Social Determinants of Health   Financial Resource Strain:    Difficulty of Paying Living Expenses: Not on file  Food Insecurity:    Worried About [place] in the Last Year: Not on file   Ran Out of Food in the Last Year: Not on file  Transportation Needs:    Lack of Transportation (Medical): Not on file   Lack of Transportation (Non-Medical): Not on file  Physical Activity:    Days of Exercise per Week: Not on file   Minutes of Exercise per Session: Not on file  Stress:    Feeling of Stress : Not on file  Social Connections:    Frequency of Communication with Friends and Family: Not on file   Frequency of Social Gatherings with Friends and Family: Not on file   Attends Religious Services: Not on file   Active Member  of Clubs or Organizations: Not on file   Attends Archivist Meetings: Not on file   Marital Status: Not on file  Intimate Partner Violence:    Fear of Current or Ex-Partner: Not on file   Emotionally Abused: Not on file   Physically Abused: Not on file   Sexually Abused: Not on file   Family History  Problem Relation Age of Onset   Anesthesia problems Neg Hx     OBJECTIVE:  Vitals:   11/30/19 1322 11/30/19 1323  BP: (!) 146/84   Pulse: 88   Resp: 19   Temp: 98 F (36.7 C)   TempSrc: Oral   SpO2: 95%   Weight:  250 lb (113.4 kg)  Height:  6' (1.829 m)     General appearance: alert; well-appearing, nontoxic; speaking in full sentences and tolerating own secretions HEENT: NCAT; Ears: EACs clear, TMs pearly gray; Eyes: PERRL.  EOM  grossly intact.Nose: nares patent without rhinorrhea, Throat: oropharynx clear, tonsils non erythematous or enlarged, uvula midline  Neck: supple without LAD Lungs: unlabored respirations, symmetrical air entry; cough: absent; no respiratory distress; CTAB Heart: regular rate and rhythm.  Skin: warm and dry Psychological: alert and cooperative; normal mood and affect   ASSESSMENT & PLAN:  1. Viral URI with cough     Meds ordered this encounter  Medications   DISCONTD: benzonatate (TESSALON) 100 MG capsule    Sig: Take 1 capsule (100 mg total) by mouth every 8 (eight) hours.    Dispense:  21 capsule    Refill:  0    Order Specific Question:   Supervising Provider    Answer:   Raylene Everts [[id number]]   benzonatate (TESSALON) 100 MG capsule    Sig: Take 1 capsule (100 mg total) by mouth every 8 (eight) hours.    Dispense:  21 capsule    Refill:  0    Order Specific Question:   Supervising Provider    Answer:   Raylene Everts [[id number]]    Get plenty of rest and push fluids Tessalon Perles prescribed for cough Use OTC zyrtec for nasal congestion, runny nose, and/or sore throat Use OTC flonase for nasal congestion and runny nose Use medications daily for symptom relief Use OTC medications like ibuprofen or tylenol as needed fever or pain Call or go to the ED if you have any new or worsening symptoms such as fever, worsening cough, shortness of breath, chest tightness, chest pain, turning blue, changes in mental status, etc...   Reviewed expectations re: course of current medical issues. Questions answered. Outlined signs and symptoms indicating need for more acute intervention. Patient verbalized understanding. After Visit Summary given.         Lestine Box, PA-C 11/30/19 1345

## 2019-11-30 NOTE — Discharge Instructions (Signed)
Get plenty of rest and push fluids Tessalon Perles prescribed for cough Use OTC zyrtec for nasal congestion, runny nose, and/or sore throat Use OTC flonase for nasal congestion and runny nose Use medications daily for symptom relief Use OTC medications like ibuprofen or tylenol as needed fever or pain Call or go to the ED if you have any new or worsening symptoms such as fever, worsening cough, shortness of breath, chest tightness, chest pain, turning blue, changes in mental status, etc..Marland Kitchen

## 2021-01-30 IMAGING — US US ABDOMEN LIMITED
1 series · 14 of 25 positions shown · non-contrast
Comparison: CT of the abdomen pelvis dated 12/01/2007.

CLINICAL DATA: 48-year-old male with right upper quadrant abdominal
pain.

EXAM:
ULTRASOUND ABDOMEN LIMITED RIGHT UPPER QUADRANT

[Series 1: us abdomen limited · 0.25mm/px · 14 of 76 slices shown]
[im 1/76]
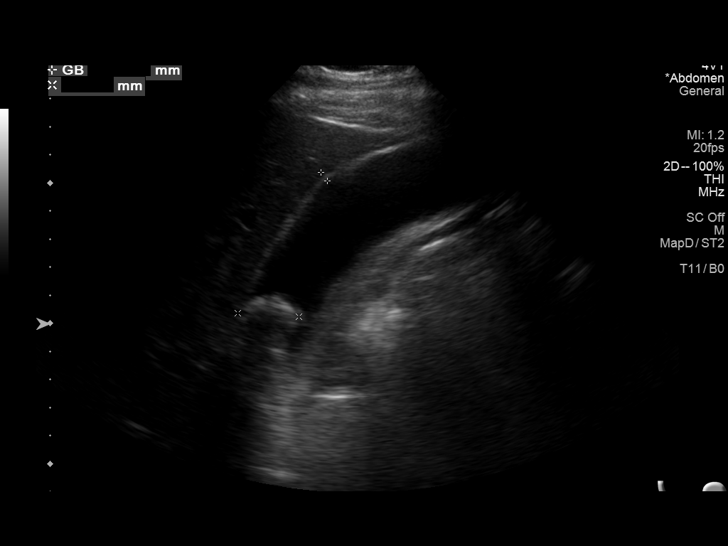
[im 7/76]
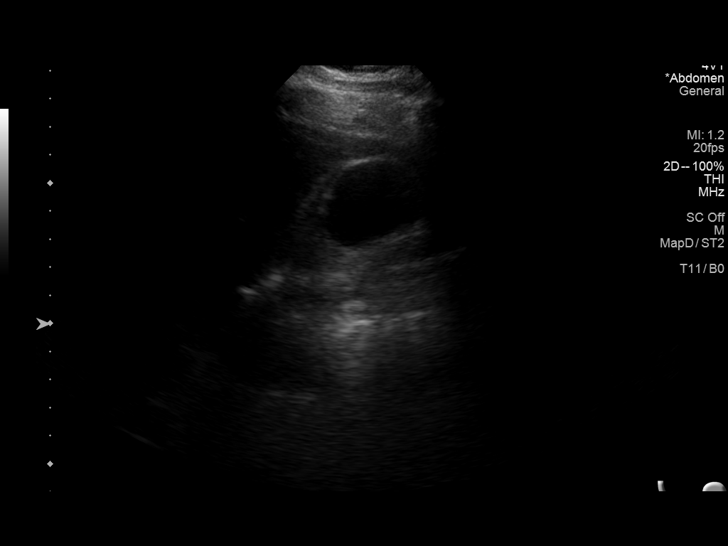
[im 13/76]
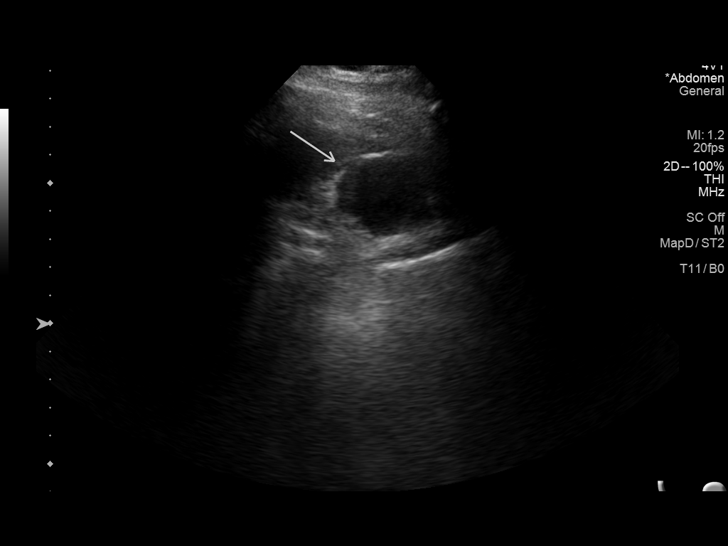
[im 19/76]
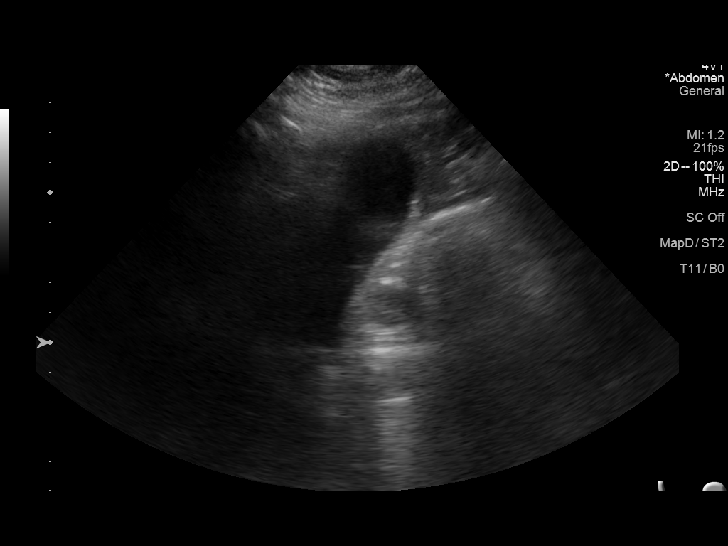
[im 26/76]
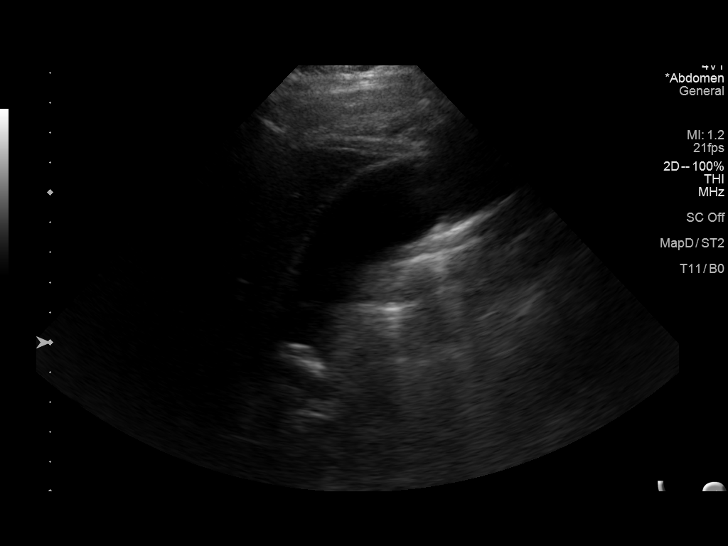
[im 29/76]
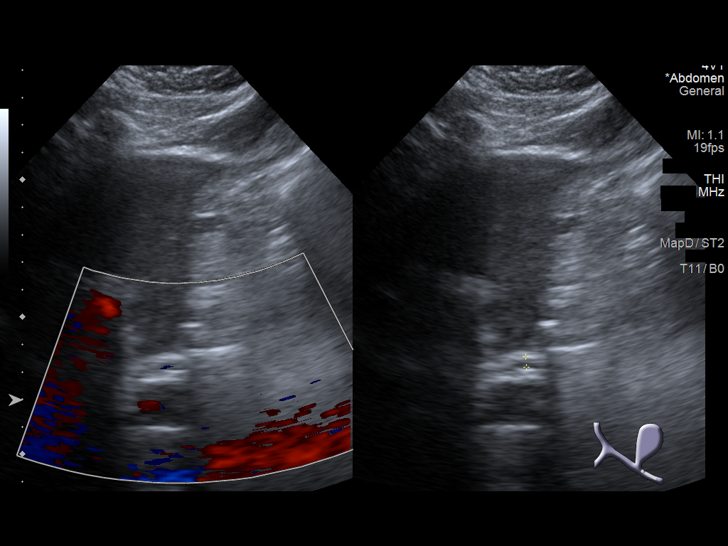
[im 35/76]
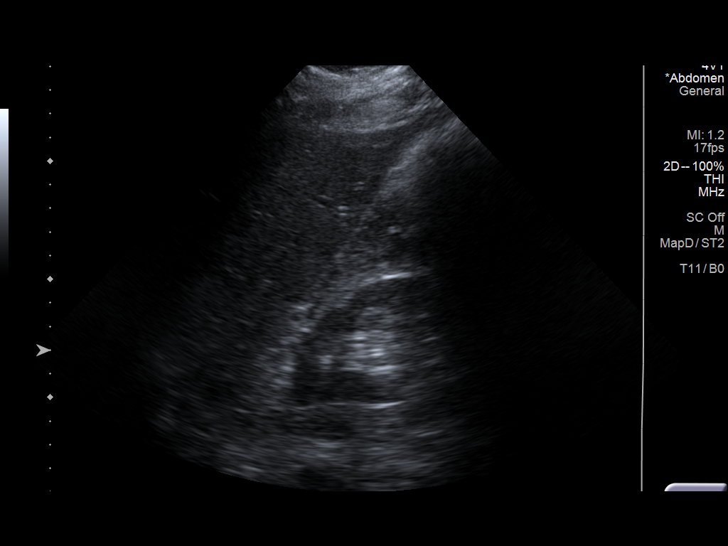
[im 41/76]
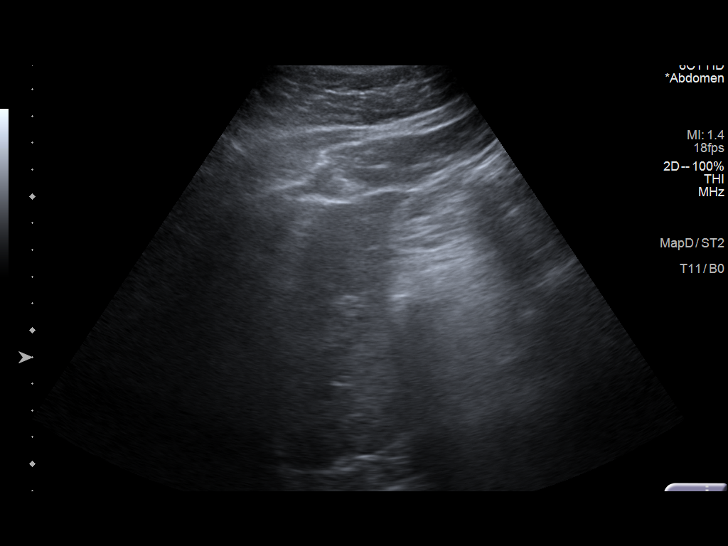
[im 47/76]
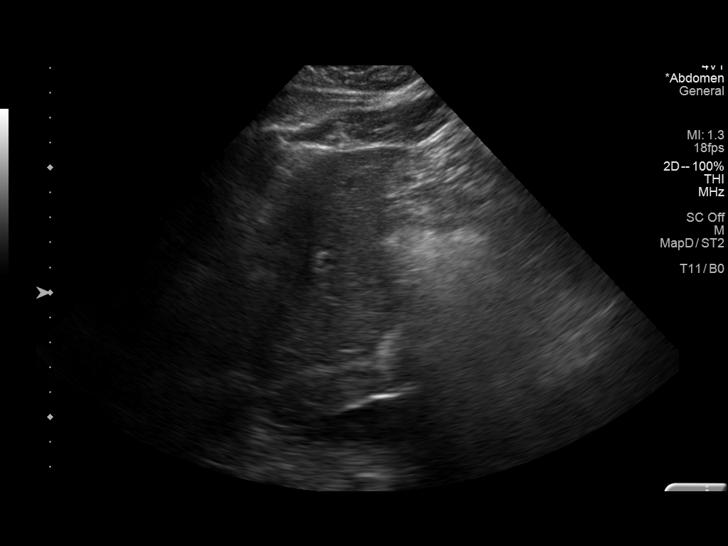
[im 51/76]
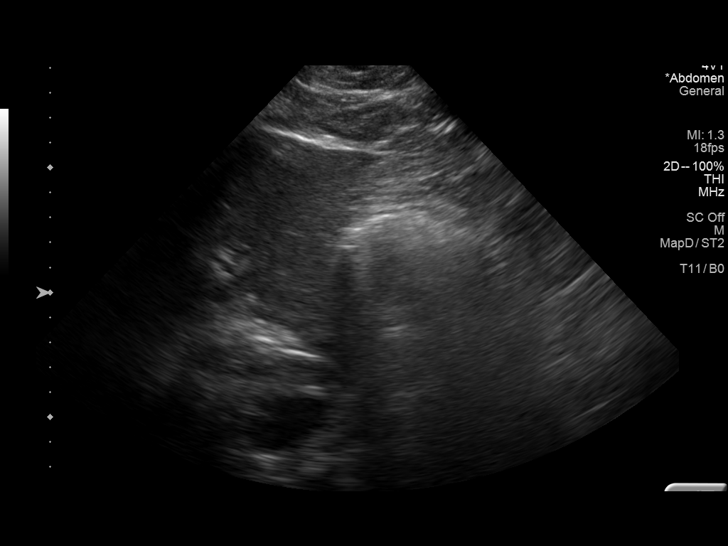
[im 57/76]
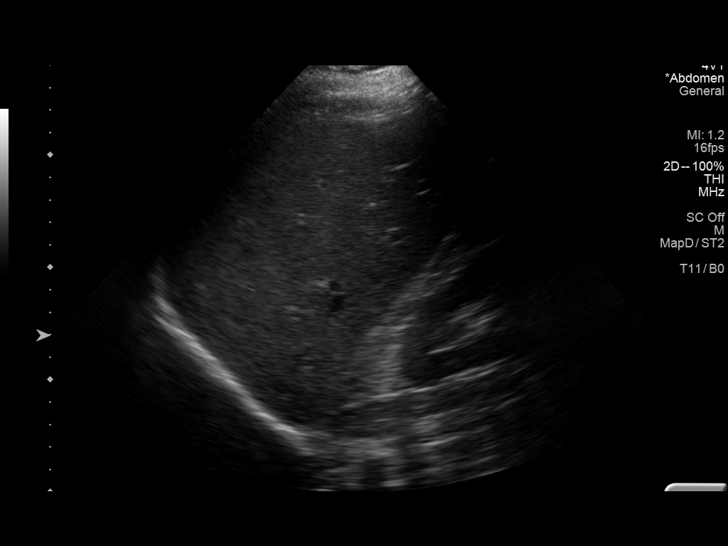
[im 63/76]
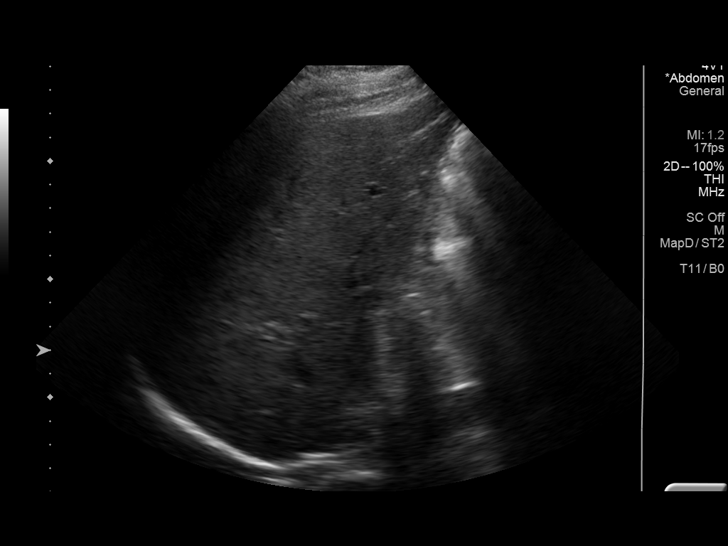
[im 69/76]
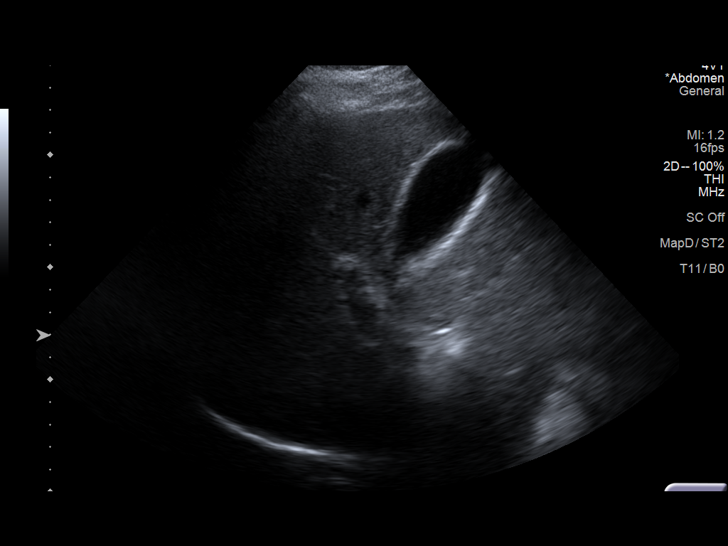
[im 76/76]
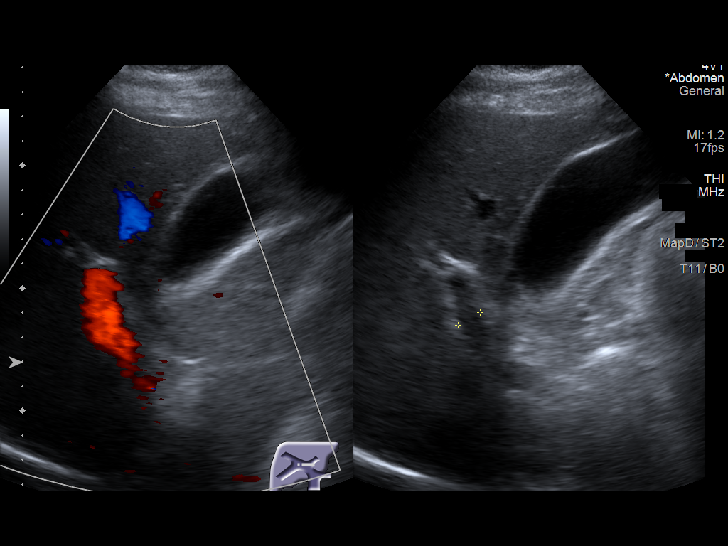

[14 of 25 positions shown; findings below may reference images not displayed]

FINDINGS: Gallbladder:

There are several stones in the gallbladder. Top-normal gallbladder
wall thickness measuring 3-4 mm. Trace pericholecystic fluid may be
present. Positive sonographic sign was reported.

Common bile duct:

Diameter: 4 mm

Liver:

Mild diffuse increased liver echogenicity most commonly seen in the
setting of fatty infiltration. Superimposed inflammation or fibrosis
is not excluded. Portal vein is patent on color Doppler imaging with
normal direction of blood flow towards the liver.

Other: None.
IMPRESSION: Cholelithiasis with findings of possible early acute cholecystitis.
A hepatobiliary scintigraphy may provide better evaluation of the
gallbladder if there is a high clinical concern for acute
cholecystitis .

## 2022-02-13 DIAGNOSIS — U071 COVID-19: Secondary | ICD-10-CM | POA: Diagnosis not present

## 2022-02-13 DIAGNOSIS — J209 Acute bronchitis, unspecified: Secondary | ICD-10-CM | POA: Diagnosis not present
